# Patient Record
Sex: Male | Born: 1985 | Race: Black or African American | Hispanic: No | Marital: Single | Smoking: Current every day smoker
Health system: Southern US, Community
[De-identification: ages and names within clinical notes are randomized; demographics above are authoritative.]

---
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
## 2010-03-27 ENCOUNTER — Inpatient Hospital Stay (HOSPITAL_COMMUNITY)
Admission: EM | Admit: 2010-03-27 | Discharge: 2010-03-29 | Payer: Self-pay | Source: Home / Self Care | Attending: Internal Medicine | Admitting: Internal Medicine

## 2010-04-03 LAB — CULTURE, ROUTINE-ABSCESS

## 2010-04-03 LAB — GRAM STAIN

## 2010-04-03 LAB — URINALYSIS, ROUTINE W REFLEX MICROSCOPIC
Bilirubin Urine: NEGATIVE
Hgb urine dipstick: NEGATIVE
Ketones, ur: NEGATIVE mg/dL
Nitrite: NEGATIVE
Protein, ur: NEGATIVE mg/dL
Specific Gravity, Urine: 1.024 (ref 1.005–1.030)
Urine Glucose, Fasting: NEGATIVE mg/dL
Urobilinogen, UA: 1 mg/dL (ref 0.0–1.0)
pH: 6.5 (ref 5.0–8.0)

## 2010-04-03 LAB — POCT I-STAT, CHEM 8
BUN: 10 mg/dL (ref 6–23)
Calcium, Ion: 1.19 mmol/L (ref 1.12–1.32)
Chloride: 101 mEq/L (ref 96–112)
Creatinine, Ser: 1.1 mg/dL (ref 0.4–1.5)
Glucose, Bld: 83 mg/dL (ref 70–99)
HCT: 47 % (ref 39.0–52.0)
Hemoglobin: 16 g/dL (ref 13.0–17.0)
Potassium: 4.3 mEq/L (ref 3.5–5.1)
Sodium: 137 mEq/L (ref 135–145)
TCO2: 29 mmol/L (ref 0–100)

## 2010-04-03 LAB — RAPID URINE DRUG SCREEN, HOSP PERFORMED
Amphetamines: NOT DETECTED
Barbiturates: NOT DETECTED
Benzodiazepines: NOT DETECTED
Cocaine: NOT DETECTED
Opiates: POSITIVE — AB
Tetrahydrocannabinol: POSITIVE — AB

## 2010-04-03 LAB — DIFFERENTIAL
Basophils Absolute: 0 10*3/uL (ref 0.0–0.1)
Basophils Relative: 0 % (ref 0–1)
Eosinophils Absolute: 0.2 10*3/uL (ref 0.0–0.7)
Eosinophils Relative: 3 % (ref 0–5)
Lymphocytes Relative: 31 % (ref 12–46)
Lymphs Abs: 2.1 10*3/uL (ref 0.7–4.0)
Monocytes Absolute: 0.5 10*3/uL (ref 0.1–1.0)
Monocytes Relative: 8 % (ref 3–12)
Neutro Abs: 4 10*3/uL (ref 1.7–7.7)
Neutrophils Relative %: 58 % (ref 43–77)

## 2010-04-03 LAB — CBC
HCT: 42.9 % (ref 39.0–52.0)
Hemoglobin: 14.1 g/dL (ref 13.0–17.0)
MCH: 29.3 pg (ref 26.0–34.0)
MCHC: 32.9 g/dL (ref 30.0–36.0)
MCV: 89.2 fL (ref 78.0–100.0)
Platelets: 183 10*3/uL (ref 150–400)
RBC: 4.81 MIL/uL (ref 4.22–5.81)
RDW: 12.3 % (ref 11.5–15.5)
WBC: 6.8 10*3/uL (ref 4.0–10.5)

## 2010-04-03 LAB — HIV ANTIBODY (ROUTINE TESTING W REFLEX): HIV: NONREACTIVE

## 2010-08-18 ENCOUNTER — Emergency Department (HOSPITAL_COMMUNITY)
Admission: EM | Admit: 2010-08-18 | Discharge: 2010-08-18 | Disposition: A | Discharge status: Home / Self Care | Payer: Self-pay | Attending: Emergency Medicine | Admitting: Emergency Medicine

## 2010-08-18 DIAGNOSIS — R300 Dysuria: Secondary | ICD-10-CM | POA: Insufficient documentation

## 2010-08-18 DIAGNOSIS — N390 Urinary tract infection, site not specified: Secondary | ICD-10-CM | POA: Insufficient documentation

## 2010-08-18 LAB — URINE MICROSCOPIC-ADD ON

## 2010-08-18 LAB — URINALYSIS, ROUTINE W REFLEX MICROSCOPIC
Bilirubin Urine: NEGATIVE
Glucose, UA: NEGATIVE mg/dL
Ketones, ur: NEGATIVE mg/dL
Nitrite: NEGATIVE
Protein, ur: NEGATIVE mg/dL
Specific Gravity, Urine: 1.016 (ref 1.005–1.030)
Urobilinogen, UA: 0.2 mg/dL (ref 0.0–1.0)
pH: 6 (ref 5.0–8.0)

## 2010-08-21 LAB — GC/CHLAMYDIA PROBE AMP, GENITAL
Chlamydia, DNA Probe: NEGATIVE
GC Probe Amp, Genital: POSITIVE — AB

## 2011-01-04 ENCOUNTER — Inpatient Hospital Stay (INDEPENDENT_AMBULATORY_CARE_PROVIDER_SITE_OTHER)
Admission: RE | Admit: 2011-01-04 | Discharge: 2011-01-04 | Disposition: A | Discharge status: Home / Self Care | Payer: Self-pay | Source: Ambulatory Visit | Attending: Family Medicine | Admitting: Family Medicine

## 2011-01-04 DIAGNOSIS — T148 Other injury of unspecified body region: Secondary | ICD-10-CM

## 2013-10-09 ENCOUNTER — Emergency Department (HOSPITAL_COMMUNITY)
Admission: EM | Admit: 2013-10-09 | Discharge: 2013-10-09 | Disposition: A | Discharge status: Home / Self Care | Payer: No Typology Code available for payment source | Attending: Emergency Medicine | Admitting: Emergency Medicine

## 2013-10-09 ENCOUNTER — Emergency Department (HOSPITAL_COMMUNITY): Payer: No Typology Code available for payment source

## 2013-10-09 ENCOUNTER — Encounter (HOSPITAL_COMMUNITY): Payer: Self-pay | Admitting: Emergency Medicine

## 2013-10-09 DIAGNOSIS — S8990XA Unspecified injury of unspecified lower leg, initial encounter: Secondary | ICD-10-CM | POA: Insufficient documentation

## 2013-10-09 DIAGNOSIS — Y9389 Activity, other specified: Secondary | ICD-10-CM | POA: Insufficient documentation

## 2013-10-09 DIAGNOSIS — IMO0002 Reserved for concepts with insufficient information to code with codable children: Secondary | ICD-10-CM | POA: Insufficient documentation

## 2013-10-09 DIAGNOSIS — Y92410 Unspecified street and highway as the place of occurrence of the external cause: Secondary | ICD-10-CM | POA: Insufficient documentation

## 2013-10-09 DIAGNOSIS — S4991XA Unspecified injury of right shoulder and upper arm, initial encounter: Secondary | ICD-10-CM

## 2013-10-09 DIAGNOSIS — S99929A Unspecified injury of unspecified foot, initial encounter: Secondary | ICD-10-CM

## 2013-10-09 DIAGNOSIS — S46909A Unspecified injury of unspecified muscle, fascia and tendon at shoulder and upper arm level, unspecified arm, initial encounter: Secondary | ICD-10-CM | POA: Insufficient documentation

## 2013-10-09 DIAGNOSIS — F17200 Nicotine dependence, unspecified, uncomplicated: Secondary | ICD-10-CM | POA: Insufficient documentation

## 2013-10-09 DIAGNOSIS — S4980XA Other specified injuries of shoulder and upper arm, unspecified arm, initial encounter: Secondary | ICD-10-CM | POA: Insufficient documentation

## 2013-10-09 DIAGNOSIS — S99919A Unspecified injury of unspecified ankle, initial encounter: Secondary | ICD-10-CM

## 2013-10-09 MED ORDER — NAPROXEN 500 MG PO TABS: 500.0000 mg | ORAL_TABLET | Freq: Two times a day (BID) | ORAL | Status: DC

## 2013-10-09 NOTE — ED Notes (Signed)
Bed: [id number] Expected date:  Expected time:  Means of arrival:  Comments: ems
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
## 2013-10-09 NOTE — Discharge Instructions (Signed)
Take Naprosyn as needed for pain. Refer to attached documents for more information.  °

## 2013-10-16 NOTE — ED Provider Notes (Signed)
Medical screening examination/treatment/procedure(s) were performed by non-physician practitioner and as supervising physician I was immediately available for consultation/collaboration.   EKG Interpretation None        Khani Paino, MD 10/16/13 0021 

## 2013-12-11 ENCOUNTER — Emergency Department (HOSPITAL_COMMUNITY)
Admission: EM | Admit: 2013-12-11 | Discharge: 2013-12-11 | Disposition: A | Discharge status: Home / Self Care | Payer: No Typology Code available for payment source | Attending: Emergency Medicine | Admitting: Emergency Medicine

## 2013-12-11 ENCOUNTER — Encounter (HOSPITAL_COMMUNITY): Payer: Self-pay | Admitting: Emergency Medicine

## 2013-12-11 DIAGNOSIS — F17200 Nicotine dependence, unspecified, uncomplicated: Secondary | ICD-10-CM | POA: Insufficient documentation

## 2013-12-11 DIAGNOSIS — Z202 Contact with and (suspected) exposure to infections with a predominantly sexual mode of transmission: Secondary | ICD-10-CM

## 2013-12-11 DIAGNOSIS — Z79899 Other long term (current) drug therapy: Secondary | ICD-10-CM | POA: Insufficient documentation

## 2013-12-11 MED ORDER — METRONIDAZOLE 500 MG PO TABS
2000.0000 mg | ORAL_TABLET | Freq: Once | ORAL | Status: AC
  Administered 2013-12-11: 2000 mg via ORAL
  Filled 2013-12-11: qty 4

## 2013-12-11 NOTE — ED Provider Notes (Signed)
CSN: [id number]     Arrival date & time 12/11/13  1358 History      Chief Complaint  Patient presents with  . Exposure to STD    The history is provided by the patient. No language interpreter was used.   HPI Comments: Frank Jordan is a 28 y.o. male who presents to the Emergency Department complaining of STD exposure.  States his girlfriend found out yesterday she has trichomonas.  Pt denies any symptoms.  Denies fevers, chills, myalgias, N/V, abdominal pain, dysuria, urinary frequency/urgency, penile discharge, testicular pain or swelling.       History reviewed. No pertinent past medical history. History reviewed. No pertinent past surgical history. History reviewed. No pertinent family history. History  Substance Use Topics  . Smoking status: Current Every Day Smoker -- 0.50 packs/day for 5 years    Types: Cigarettes  . Smokeless tobacco: Not on file  . Alcohol Use: No    Review of Systems  All other systems reviewed and are negative.     Allergies  Review of patient's allergies indicates no known allergies.  Home Medications   Prior to Admission medications   Medication Sig Start Date End Date Taking? Authorizing Provider  naproxen (NAPROSYN) 500 MG tablet Take 1 tablet (500 mg total) by mouth 2 (two) times daily with a meal. 10/09/13   Kaitlyn Szekalski, PA-C   BP 153/85  Pulse 109  Temp(Src) 97.6 F (36.4 C) (Oral)  Resp 18  SpO2 100% Physical Exam  Nursing note and vitals reviewed. Constitutional: He appears well-developed and well-nourished. No distress.  HENT:  Head: Normocephalic and atraumatic.  Neck: Neck supple.  Pulmonary/Chest: Effort normal.  Abdominal: Soft. He exhibits no distension. There is no tenderness. There is no rebound and no guarding.  Genitourinary: Penis normal. Right testis shows no mass, no swelling and no tenderness. Right testis is descended. Left testis shows no mass, no swelling and no tenderness. Left testis is descended.  Circumcised. No penile erythema or penile tenderness. No discharge found.  Lymphadenopathy:       Right: No inguinal adenopathy present.       Left: No inguinal adenopathy present.  Neurological: He is alert.  Skin: He is not diaphoretic.  Psychiatric: He has a normal mood and affect. His behavior is normal.    ED Course  Procedures (including critical care time) DIAGNOSTIC STUDIES: Oxygen Saturation is 100% on RA,normal by my interpretation.    COORDINATION OF CARE: 3:03 PM Discussed treatment plan with pt at bedside and pt agreed to plan.    Labs Review Labs Reviewed - No data to display  Imaging Review No results found.   EKG Interpretation None      MDM   Final diagnoses:  STD exposure    Afebrile nontoxic patient with reported STD exposure to trichomonas. Patient is asymptomatic. STD panel ordered. Patient treated empirically for for trichomonas with Flagyl. Discharged home with Dr. Chaney Born health department followup.  Pt aware all tests are pending. Discussedfindings, treatment, and follow up  with patient.  Pt given return precautions.  Pt verbalizes understanding and agrees with plan.         [place], PA-C 12/11/13 1519

## 2013-12-11 NOTE — ED Provider Notes (Signed)
Medical screening examination/treatment/procedure(s) were performed by non-physician practitioner and as supervising physician I was immediately available for consultation/collaboration.    Vida Roller, MD 12/11/13 [id number]

## 2013-12-11 NOTE — Discharge Instructions (Signed)
Read the information below.  You may return to the Emergency Department at any time for worsening condition or any new symptoms that concern you. °

## 2013-12-11 NOTE — ED Notes (Signed)
Pt wanting to be checked for std due to ex\posure, no complaints.

## 2013-12-12 LAB — GC/CHLAMYDIA PROBE AMP
CT Probe RNA: NEGATIVE
GC Probe RNA: NEGATIVE

## 2013-12-12 LAB — RPR

## 2013-12-12 LAB — HIV ANTIBODY (ROUTINE TESTING W REFLEX): HIV 1&2 Ab, 4th Generation: NONREACTIVE

## 2014-02-01 ENCOUNTER — Emergency Department (HOSPITAL_COMMUNITY)
Admission: EM | Admit: 2014-02-01 | Discharge: 2014-02-01 | Disposition: A | Discharge status: Home / Self Care | Payer: No Typology Code available for payment source | Attending: Emergency Medicine | Admitting: Emergency Medicine

## 2014-02-01 ENCOUNTER — Encounter (HOSPITAL_COMMUNITY): Payer: Self-pay | Admitting: *Deleted

## 2014-02-01 DIAGNOSIS — R369 Urethral discharge, unspecified: Secondary | ICD-10-CM | POA: Insufficient documentation

## 2014-02-01 DIAGNOSIS — Z113 Encounter for screening for infections with a predominantly sexual mode of transmission: Secondary | ICD-10-CM | POA: Insufficient documentation

## 2014-02-01 DIAGNOSIS — Z202 Contact with and (suspected) exposure to infections with a predominantly sexual mode of transmission: Secondary | ICD-10-CM

## 2014-02-01 DIAGNOSIS — Z711 Person with feared health complaint in whom no diagnosis is made: Secondary | ICD-10-CM

## 2014-02-01 DIAGNOSIS — Z720 Tobacco use: Secondary | ICD-10-CM | POA: Insufficient documentation

## 2014-02-01 LAB — URINALYSIS, ROUTINE W REFLEX MICROSCOPIC
Bilirubin Urine: NEGATIVE
Glucose, UA: NEGATIVE mg/dL
Hgb urine dipstick: NEGATIVE
Ketones, ur: 15 mg/dL — AB
Leukocytes, UA: NEGATIVE
Nitrite: NEGATIVE
Protein, ur: NEGATIVE mg/dL
Specific Gravity, Urine: 1.027 (ref 1.005–1.030)
Urobilinogen, UA: 1 mg/dL (ref 0.0–1.0)
pH: 6.5 (ref 5.0–8.0)

## 2014-02-01 LAB — RPR

## 2014-02-01 MED ORDER — CEFTRIAXONE SODIUM 250 MG IJ SOLR
250.0000 mg | Freq: Once | INTRAMUSCULAR | Status: AC
  Administered 2014-02-01: 250 mg via INTRAMUSCULAR
  Filled 2014-02-01: qty 250

## 2014-02-01 MED ORDER — LIDOCAINE HCL (PF) 1 % IJ SOLN
5.0000 mL | Freq: Once | INTRAMUSCULAR | Status: AC
  Administered 2014-02-01: 5 mL

## 2014-02-01 MED ORDER — METRONIDAZOLE 500 MG PO TABS
2000.0000 mg | ORAL_TABLET | Freq: Once | ORAL | Status: AC
  Administered 2014-02-01: 2000 mg via ORAL
  Filled 2014-02-01: qty 4

## 2014-02-01 MED ORDER — LIDOCAINE HCL (PF) 1 % IJ SOLN
INTRAMUSCULAR | Status: AC
  Administered 2014-02-01: 5 mL
  Filled 2014-02-01: qty 5

## 2014-02-01 MED ORDER — AZITHROMYCIN 250 MG PO TABS
1000.0000 mg | ORAL_TABLET | Freq: Once | ORAL | Status: AC
  Administered 2014-02-01: 1000 mg via ORAL
  Filled 2014-02-01: qty 4

## 2014-02-01 NOTE — Discharge Instructions (Signed)
1. Medications: usual home medications 2. Treatment: rest, drink plenty of fluids,  3. Follow Up: Please followup with your primary doctor in 1 week for discussion of your diagnoses and further evaluation after today's visit; if you do not have a primary care doctor use the resource guide provided to find one; Please return to the ER for  abd pain, N/V/D, fevers or other concerning symptoms.   Sexually Transmitted Disease A sexually transmitted disease (STD) is a disease or infection that may be passed (transmitted) from person to person, usually during sexual activity. This may happen by way of saliva, semen, blood, vaginal mucus, or urine. Common STDs include:   Gonorrhea.   Chlamydia.   Syphilis.   HIV and AIDS.   Genital herpes.   Hepatitis B and C.   Trichomonas.   Human papillomavirus (HPV).   Pubic lice.   Scabies.  Mites.  Bacterial vaginosis. WHAT ARE CAUSES OF STDs? An STD may be caused by bacteria, a virus, or parasites. STDs are often transmitted during sexual activity if one person is infected. However, they may also be transmitted through nonsexual means. STDs may be transmitted after:   Sexual intercourse with an infected person.   Sharing sex toys with an infected person.   Sharing needles with an infected person or using unclean piercing or tattoo needles.  Having intimate contact with the genitals, mouth, or rectal areas of an infected person.   Exposure to infected fluids during birth. WHAT ARE THE SIGNS AND SYMPTOMS OF STDs? Different STDs have different symptoms. Some people may not have any symptoms. If symptoms are present, they may include:   Painful or bloody urination.   Pain in the pelvis, abdomen, vagina, anus, throat, or eyes.   A skin rash, itching, or irritation.  Growths, ulcerations, blisters, or sores in the genital and anal areas.  Abnormal vaginal discharge with or without bad odor.   Penile discharge in men.    Fever.   Pain or bleeding during sexual intercourse.   Swollen glands in the groin area.   Yellow skin and eyes (jaundice). This is seen with hepatitis.   Swollen testicles.  Infertility.  Sores and blisters in the mouth. HOW ARE STDs DIAGNOSED? To make a diagnosis, your health care provider may:   Take a medical history.   Perform a physical exam.   Take a sample of any discharge to examine.  Swab the throat, cervix, opening to the penis, rectum, or vagina for testing.  Test a sample of your first morning urine.   Perform blood tests.   Perform a Pap test, if this applies.   Perform a colposcopy.   Perform a laparoscopy.  HOW ARE STDs TREATED? Treatment depends on the STD. Some STDs may be treated but not cured.   Chlamydia, gonorrhea, trichomonas, and syphilis can be cured with antibiotic medicine.   Genital herpes, hepatitis, and HIV can be treated, but not cured, with prescribed medicines. The medicines lessen symptoms.   Genital warts from HPV can be treated with medicine or by freezing, burning (electrocautery), or surgery. Warts may come back.   HPV cannot be cured with medicine or surgery. However, abnormal areas may be removed from the cervix, vagina, or vulva.   If your diagnosis is confirmed, your recent sexual partners need treatment. This is true even if they are symptom-free or have a negative culture or evaluation. They should not have sex until their health care providers say it is okay. HOW CAN I  REDUCE MY RISK OF GETTING AN STD? Take these steps to reduce your risk of getting an STD:  Use latex condoms, dental dams, and water-soluble lubricants during sexual activity. Do not use petroleum jelly or oils.  Avoid having multiple sex partners.  Do not have sex with someone who has other sex partners.  Do not have sex with anyone you do not know or who is at high risk for an STD.  Avoid risky sex practices that can break your  skin.  Do not have sex if you have open sores on your mouth or skin.  Avoid drinking too much alcohol or taking illegal drugs. Alcohol and drugs can affect your judgment and put you in a vulnerable position.  Avoid engaging in oral and anal sex acts.  Get vaccinated for HPV and hepatitis. If you have not received these vaccines in the past, talk to your health care provider about whether one or both might be right for you.   If you are at risk of being infected with HIV, it is recommended that you take a prescription medicine daily to prevent HIV infection. This is called pre-exposure prophylaxis (PrEP). You are considered at risk if:  You are a man who has sex with other men (MSM).  You are a heterosexual man or woman and are sexually active with more than one partner.  You take drugs by injection.  You are sexually active with a partner who has HIV.  Talk with your health care provider about whether you are at high risk of being infected with HIV. If you choose to begin PrEP, you should first be tested for HIV. You should then be tested every 3 months for as long as you are taking PrEP.  WHAT SHOULD I DO IF I THINK I HAVE AN STD?  See your health care provider.   Tell your sexual partner(s). They should be tested and treated for any STDs.  Do not have sex until your health care provider says it is okay. WHEN SHOULD I GET IMMEDIATE MEDICAL CARE? Contact your health care provider right away if:   You have severe abdominal pain.  You are a man and notice swelling or pain in your testicles.  You are a woman and notice swelling or pain in your vagina. Document Released: 05/26/2002 Document Revised: 03/10/2013 Document Reviewed: 09/23/2012 [facility] Patient Information 2015 [place], [place]. This information is not intended to replace advice given to you by your health care provider. Make sure you discuss any questions you have with your health care provider.   Emergency  Department Resource Guide 1) Find a Doctor and Pay Out of Pocket Although you won't have to find out who is covered by your insurance plan, it is a good idea to ask around and get recommendations. You will then need to call the office and see if the doctor you have chosen will accept you as a new patient and what types of options they offer for patients who are self-pay. Some doctors offer discounts or will set up payment plans for their patients who do not have insurance, but you will need to ask so you aren't surprised when you get to your appointment.  2) Contact Your Local Health Department Not all health departments have doctors that can see patients for sick visits, but many do, so it is worth a call to see if yours does. If you don't know where your local health department is, you can check in your phone book. The  CDC also has a tool to help you locate your state's health department, and many state websites also have listings of all of their local health departments.  3) Find a Walk-in Clinic If your illness is not likely to be very severe or complicated, you may want to try a walk in clinic. These are popping up all over the country in pharmacies, drugstores, and shopping centers. They're usually staffed by nurse practitioners or physician assistants that have been trained to treat common illnesses and complaints. They're usually fairly quick and inexpensive. However, if you have serious medical issues or chronic medical problems, these are probably not your best option.  No Primary Care Doctor: - Call Health Connect at  [phone] - they can help you locate a primary care doctor that  accepts your insurance, provides certain services, etc. - Physician Referral Service- [phone]  Chronic Pain Problems: Organization         Address  Phone   Notes  [facility]  [phone] Patients need to be referred by their primary care doctor.   Medication  Assistance: Organization         Address  Phone   Notes  [facility]ication [facility] [address] [place], [place] [address] [phone] --Must be a resident of [facility] -- Must have NO insurance coverage whatsoever (no Medicaid/ Medicare, etc.) -- The pt. MUST have a primary care doctor that directs their care regularly and follows them in the community   Med[facility]ist  [phone]   [employer]  [phone]    Agencies that provide inexpensive medical care: Organization         Address  Phone   Notes  [facility]  [phone]   [facility]    [phone]   [facility] [address] [place], [place] [address] [phone]) [phone]   [facility] [address], [place] [phone]   Planned Parenthood    [phone]   [facility]    [phone]   [facility]  [address], [place] Phone:  [phone]) [phone], Fax:  [phone] Hours of Operation:  9 am - 6 pm, M-F.  Also accepts Medicaid/Medicare and self-pay.  [facility]  [address], [place] Phone: [phone], Fax: [phone]. Hours of Operation:  8:30 am - 5:30 pm, M-F.  Also accepts Medicaid and self-pay.  [facility] [place] [address], [place] Phone: [phone]   [facility] [address], [place], [place] [phone], Ext. [phone] Mondays & Thursdays: 7-9 AM.  First 15 patients are seen on a first come, first serve basis.    Medicaid-accepting [facility] Providers:  Organization         Address  Phone   Notes  [facility] [address],  [phone] Also accepts self-pay patients.  [facility] [address], [place]  [phone]   [facility] [address], [place]  [phone]   [facility] [address] [place] [address], [place] [phone]) [phone]   Renaye RakersVeita Bland [address], [place]   [phone]) [phone] Only accepts [place] Access [place] patients after they have their name applied to their card.   Self-Pay (no  insurance) in [place]:  Organization         Address  Phone   Notes  Sickle Cell Patients, [facility] [address] [place], [place] [phone]   [facility] [address] [place], [place] [phone]   [facility] [place]  [address], [place] [phone]   [facility]/Dr. Osei-Bonsu  [address] [place] or [address], [place] [phone] Phone number for both [place] and [place] locations is the same.  [facility] [address], [place] [phone]   [facility] [address], [place] or [address] [phone]   [facility] [address] [place] [phone], phone; [phone], fax Sees patients 1st and 3rd Saturday of every month.  Must not qualify for public or private insurance (i.e. Medicaid, Medicare, [place] Health Choice, Veterans' Benefits)  Household income should be no more than 200% of the poverty level The clinic cannot treat you if you are pregnant or think you are pregnant  Sexually transmitted diseases are not treated at the clinic.    Dental Care: Organization         Address  Phone  Notes  [facility] of [facility] [address] [place], [place] [phone] Accepts children up to age 3 who are enrolled in [place] or [place] Health Choice; pregnant women with a Medicaid card; and children who have applied for Medicaid or [place] Health Choice, but were declined, whose parents can pay a reduced fee at time of service.  [facility]  Department [facility]  [address], [place] [phone] Accepts children up to age 54 who are enrolled in [place] or [place] Health Choice; pregnant women with a Medicaid card; and children who have applied for Medicaid or [place] Health Choice, but were declined, whose parents can pay a reduced fee at time of service.  [facility]  [address] [place], [place] [phone] Patients are seen by appointment only. Walk-ins are not accepted. [facility] will see patients 91 years of age and older. Monday - Tuesday (8am-5pm) Most Wednesdays (8:30-5pm) $30 per visit, cash only  [facility]  [address], [facility] [phone] Patients are seen by appointment only. Walk-ins are not accepted. [facility] will see patients 17 years of age and older. One Wednesday Evening (Monthly: Volunteer Based).  $30 per visit, cash only  [employer]  [phone] for adults; Children under age 30, call Graduate Pediatric Dentistry at [phone]. Children aged 37-14, please call [phone] to request a pediatric application.  Dental services are provided in all areas of dental care including fillings, crowns and bridges, complete and partial dentures, implants, gum treatment, root canals, and extractions. Preventive care is also provided. Treatment is provided to both adults and children. Patients are selected via a lottery and there is often a waiting list.   [facility] [address] [place]  [phone] [email or web address]   [facility] [address] [place], [place] [phone], Ext. [phone] Second and Fourth Thursday of each month, opens at 6:30 AM; Clinic ends at 9 AM.  Patients are seen on a first-come first-served basis, and a limited number are seen during  each clinic.   [facility]  [address] Ether [address]ins [place], [place] [phone]    Eligibility Requirements You must have lived in [place], [place], or [place] counties for at least the last three months.   You cannot be eligible for state or federal sponsored [place], including [employer], [place], or [employer].   You generally cannot be eligible for healthcare insurance through your employer.    How to apply: Eligibility screenings are held every Tuesday and Wednesday afternoon from 1:00 pm until 4:00 pm. You do not need an appointment for the interview!  [facility] [address], [place], [place] [phone]   [facility] Health Department  [phone]   [facility] Health Department  [phone]   [facility] Health Department  [phone]    Behavioral Health Resources in the Community: Intensive Outpatient Programs Organization         Address  Phone  Notes  [facility] [address] [place], [place] [phone]   [facility]patient [address], [place], [place] [phone]   [facility] [address] [place], [place]  [phone]   [facility] [address],  [place], [place] [phone] or [phone]   Substance Abuse Resources Organization         Address  Phone  Notes  [facility] Drug Services  [phone]   [facility]  [phone]   [facility] [place]  [phone]   Floydene Flock  [phone]   Residential & Outpatient Substance Abuse Program  [phone]   Psychological Services Organization         Address  Phone  Notes  [facility]  [phone]   [facility] Services  [phone]   [facility] [address], [place] [phone] or [phone]    Mobile Crisis Teams Organization         Address  Phone  Notes  [facility], Mobile Crisis Care Unit  [phone]   [facility]  [address]  [place], [place] [phone]   Doristine Locks [address] [place] [phone]    Self-Help/Support Groups Organization         Address  Phone             Notes  [facility] [place] - variety of support groups  336- I7437963 Call for more information  Narcotics Anonymous (NA), [facility] [address], [employer] [place]  2 meetings at this location   Statistician         Address  Phone  Notes  [facility] [address],    [place]  [phone]   [employer]  [address] [place] [address], [place], [place] [phone]   [facility] [address] [place], [place] [phone] Admissions: 8am-3pm M-F  [facility] [address],    [place], [place] [phone]   [facility] [address] [place], [place] [phone]   [facility] [address],  [place], [place] [phone]   [facility] - Intensive Outpatient [address], [place], [place] [phone]   [facility] (Addiction Recovery [facility] [address] [place]  [place], [place] [phone] or [phone]   [facility] ([facility]) [address], [place], [place] [phone] Accepts Medicaid  [facility] [address]  [place]  [place] [phone] Substance Abuse/Addiction Treatment   [facility] Organization         Address  Phone  Notes  [facility]  [phone]   Angie Fava, PhD [address] Ervin Knack [place], [place]   [phone] or [phone]   [facility]   [address] [place], [place] [phone]   [facility] [address] [place], [place] [phone] Insurance/Medicaid/sponsorship through [facility] Families [address]                                    [place], [place] [phone] Therapy/tele-psych/case    [facility] [address], [place] [phone]    Dr. Lolly Mustache  [phone]   [facility]  [employer] [facility] Dept. 1) [address], [place] 2) [address] [place] 3)  [address], [place] [phone]  [phone]   [facility] Child Abuse Hotline [phone] or [phone] (After Hours)

## 2014-02-01 NOTE — ED Notes (Signed)
Refused wheelchair 

## 2014-02-01 NOTE — ED Provider Notes (Signed)
CSN: [id number]     Arrival date & time 02/01/14  1245 History  This chart was scribed for non-physician practitioner Dierdre ForthHannah Mahealani Sulak, PA-C, working with Linwood DibblesJon Knapp, MD by Littie Deedsichard Sun, ED Scribe. This patient was seen in room TR10C/TR10C and the patient's care was started at 2:55 PM.     Chief Complaint  Patient presents with  . Exposure to STD    The history is provided by the patient. No language interpreter was used.   HPI Comments: Frank Jordan is a 28 y.o. male who presents to the Emergency Department complaining of exposure to STD. Patient states his girlfriend of 6 months is pregnant; she went in to see a doctor for first visit and she was told she has an STD (trichomonas). He does not always use a condom when having sexual intercourse. Patient would like to be checked for STDs. He has hx of a prior STD, but he does not remember what STD he had. He denies testicular pain, penile pain, penile discharge, dysuria, and abdominal pain.  History reviewed. No pertinent past medical history. History reviewed. No pertinent past surgical history. History reviewed. No pertinent family history. History  Substance Use Topics  . Smoking status: Current Every Day Smoker -- 0.50 packs/day for 5 years    Types: Cigarettes  . Smokeless tobacco: Not on file  . Alcohol Use: No    Review of Systems  Constitutional: Negative for fever, diaphoresis, appetite change, fatigue and unexpected weight change.  HENT: Negative for mouth sores.   Eyes: Negative for visual disturbance.  Respiratory: Negative for cough, chest tightness, shortness of breath and wheezing.   Cardiovascular: Negative for chest pain.  Gastrointestinal: Negative for nausea, vomiting, abdominal pain, diarrhea and constipation.  Endocrine: Negative for polydipsia, polyphagia and polyuria.  Genitourinary: Negative for dysuria, urgency, frequency, hematuria, discharge, penile pain and testicular pain.  Musculoskeletal: Negative for  back pain and neck stiffness.  Skin: Negative for rash.  Allergic/Immunologic: Negative for immunocompromised state.  Neurological: Negative for syncope, light-headedness and headaches.  Hematological: Does not bruise/bleed easily.  Psychiatric/Behavioral: Negative for sleep disturbance. The patient is not nervous/anxious.       Allergies  Review of patient's allergies indicates no known allergies.  Home Medications   Prior to Admission medications   Not on File   BP 147/90 mmHg  Pulse 105  Temp(Src) 98.4 F (36.9 C) (Oral)  Resp 18  SpO2 97% Physical Exam  Constitutional: He appears well-developed and well-nourished. No distress.  Awake, alert, nontoxic appearance  HENT:  Head: Normocephalic and atraumatic.  Mouth/Throat: Oropharynx is clear and moist. No oropharyngeal exudate.  Eyes: Conjunctivae are normal. No scleral icterus.  Neck: Normal range of motion. Neck supple.  Cardiovascular: Normal rate, regular rhythm, normal heart sounds and intact distal pulses.   Pulmonary/Chest: Effort normal and breath sounds normal. No respiratory distress. He has no wheezes.  Equal chest expansion  Abdominal: Soft. Bowel sounds are normal. He exhibits no mass. There is no tenderness. There is no rebound and no guarding. Hernia confirmed negative in the right inguinal area and confirmed negative in the left inguinal area.  Genitourinary: Testes normal. Right testis shows no mass, no swelling and no tenderness. Right testis is descended. Cremasteric reflex is not absent on the right side. Left testis shows no mass, no swelling and no tenderness. Left testis is descended. Cremasteric reflex is not absent on the left side. No phimosis, paraphimosis, hypospadias, penile erythema or penile tenderness. Discharge (clear) found.  Clear discharge from the urethral meatus.   Musculoskeletal: Normal range of motion. He exhibits no edema.  Lymphadenopathy:       Right: No inguinal adenopathy present.        Left: No inguinal adenopathy present.  Neurological: He is alert.  Speech is clear and goal oriented Moves extremities without ataxia  Skin: Skin is warm and dry. He is not diaphoretic.  Psychiatric: He has a normal mood and affect.  Nursing note and vitals reviewed.   ED Course  Procedures  DIAGNOSTIC STUDIES: Oxygen Saturation is 97% on room air, normal by my interpretation.    COORDINATION OF CARE: 2:58 PM-Discussed treatment plan which includes STD treatment with pt at bedside and pt agreed to plan.    Labs Review Labs Reviewed  URINALYSIS, ROUTINE W REFLEX MICROSCOPIC - Abnormal; Notable for the following:    Ketones, ur 15 (*)    All other components within normal limits  GC/CHLAMYDIA PROBE AMP  RPR  HIV ANTIBODY (ROUTINE TESTING)    Imaging Review No results found.   EKG Interpretation None      MDM   Final diagnoses:  Concern about STD in male without diagnosis  STD exposure   Frank Jordan Presents with concerns for STD and exposure to trichomonas.  STD cultures obtained including HIV, syphilis, gonorrhea, Chlamydia and urinalysis sent for evaluation for trichomonas.  A shin treated with azithromycin, Rocephin and metronidazole to cover for gonorrhea, chlamydia and trichomonas. Patient to be discharged with instructions to follow up with PCP. Discussed importance of using protection when sexually active. Pt understands that they have GC/Chlamydia cultures pending and that they will need to inform all sexual partners if results return positive. I have also discussed reasons to return immediately to the ER. Patient expresses understanding and agrees with plan.  I have personally reviewed patient's vitals, nursing note and any pertinent labs or imaging.  I performed an focused physical exam; undressed when appropriate .    It has been determined that no acute conditions requiring further emergency intervention are present at this time. The patient/guardian  have been advised of the diagnosis and plan. I reviewed any labs and imaging including any potential incidental findings. We have discussed signs and symptoms that warrant return to the ED and they are listed in the discharge instructions.    Vital signs are stable at discharge.   BP 147/90 mmHg  Pulse 105  Temp(Src) 98.4 F (36.9 C) (Oral)  Resp 18  SpO2 97%  I personally performed the services described in this documentation, which was scribed in my presence. The recorded information has been reviewed and is accurate.    Dahlia ClientHannah Tahjanae Blankenburg, PA-C 02/01/14 1541  Linwood DibblesJon Knapp, MD 02/02/14 [phone]

## 2014-02-01 NOTE — ED Notes (Signed)
Pt states his girlfriend is preg and she went to dr for first visit and she was told she has STD now pt is here to be checked for same

## 2014-02-01 NOTE — ED Notes (Signed)
Pt reports possible exposure to std and wants to be checked, denies all symptoms.

## 2014-02-02 LAB — HIV ANTIBODY (ROUTINE TESTING W REFLEX): HIV 1&2 Ab, 4th Generation: NONREACTIVE

## 2014-02-02 LAB — GC/CHLAMYDIA PROBE AMP
CT Probe RNA: NEGATIVE
GC Probe RNA: NEGATIVE

## 2015-10-18 ENCOUNTER — Emergency Department (HOSPITAL_COMMUNITY)
Admission: EM | Admit: 2015-10-18 | Discharge: 2015-10-18 | Disposition: A | Discharge status: Home / Self Care | Payer: Self-pay | Attending: Physician Assistant | Admitting: Physician Assistant

## 2015-10-18 ENCOUNTER — Encounter (HOSPITAL_COMMUNITY): Payer: Self-pay

## 2015-10-18 DIAGNOSIS — F17210 Nicotine dependence, cigarettes, uncomplicated: Secondary | ICD-10-CM | POA: Insufficient documentation

## 2015-10-18 DIAGNOSIS — M25512 Pain in left shoulder: Secondary | ICD-10-CM

## 2015-10-18 DIAGNOSIS — M545 Low back pain: Secondary | ICD-10-CM

## 2015-10-18 MED ORDER — NAPROXEN 500 MG PO TABS: 500.0000 mg | ORAL_TABLET | Freq: Two times a day (BID) | ORAL | 0 refills | Status: DC

## 2015-10-18 MED ORDER — METHOCARBAMOL 500 MG PO TABS: 500.0000 mg | ORAL_TABLET | Freq: Two times a day (BID) | ORAL | 0 refills | Status: DC

## 2015-10-18 NOTE — Discharge Instructions (Signed)
Take the prescribed medication as directed. Follow-up with a primary care doctor-- your insurance company can help you find one. If there are any issues with your insurance, may follow-up at the wellness. Return to the ED for new or worsening symptoms.

## 2015-10-18 NOTE — ED Notes (Signed)
Pt c/o LBP and left shoulder pain "for months". States he works at [employer].

## 2015-10-18 NOTE — ED Triage Notes (Signed)
Per Pt, Pt is coming from home with a complaint of left shoulder pain for one month and lower back pain intermittent for one year. Pt reports being employed at [employer] and being involved in moving boxes. Pt reports when lying abnormally and working, pain is worsened. Better with stretching.

## 2015-10-18 NOTE — ED Provider Notes (Signed)
[facility]-EMERGENCY DEPT Provider Note  First Provider Contact:  None    By signing my name below, I, Frank Jordan, attest that this documentation has been prepared under the direction and in the presence of Frank Sites, PA-C. Electronically Signed: Earmon Jordan, ED Scribe. 10/18/15. 1:10 PM.  History   Chief Complaint Chief Complaint  Patient presents with  . Back Pain  . Shoulder Pain    The history is provided by the patient and medical records. No language interpreter was used.    HPI Comments:  Frank Jordan is a 30 y.o. male who presents to the Emergency Department complaining of lower back pain and left shoulder pain that began several months ago. He states he works for [employer] and lifts heavy objects often and repetitively. He has not taken anything for pain today but has taken Ibuprofen 800 mg in the past with minimal relief. He denies modifying factors. He denies numbness, tingling or weakness of the upper extremities, bruising, wounds, trauma, injury or fall. He is right-hand dominant. He denies any known allergies to any medications. He does not have a PCP.  History reviewed. No pertinent past medical history.  There are no active problems to display for this patient.   History reviewed. No pertinent surgical history.     Home Medications    Prior to Admission medications   Not on File    Family History No family history on file.  Social History Social History  Substance Use Topics  . Smoking status: Current Every Day Smoker    Packs/day: 0.50    Years: 5.00    Types: Cigarettes  . Smokeless tobacco: Never Used  . Alcohol use Yes     Allergies   Review of patient's allergies indicates no known allergies.   Review of Systems Review of Systems  Musculoskeletal: Positive for back pain and myalgias.  Skin: Negative for color change and wound.  Neurological: Negative for weakness and numbness.  All other systems reviewed and are  negative.    Physical Exam Updated Vital Signs BP 120/85 (BP Location: Right Arm)   Pulse 108   Temp 98.4 F (36.9 C) (Oral)   Resp 20   SpO2 98%   Physical Exam  Constitutional: He is oriented to person, place, and time. He appears well-developed and well-nourished.  HENT:  Head: Normocephalic and atraumatic.  Mouth/Throat: Oropharynx is clear and moist.  Eyes: Conjunctivae and EOM are normal. Pupils are equal, round, and reactive to light.  Neck: Normal range of motion.  Cardiovascular: Normal rate, regular rhythm and normal heart sounds.   Pulmonary/Chest: Effort normal and breath sounds normal.  Abdominal: Soft. Bowel sounds are normal.  Musculoskeletal: Normal range of motion. He exhibits no edema or deformity.  Left shoulder normal in appearance, no swelling or bony deformities, no focal tenderness; full ROM maintained in all directions without any apparent pain, normal grip strength C/T spine non-tender L spine without midline deformities or step-off; muscular tenderness noted; normal strength/sensation of both legs, normal gait unassisted  Neurological: He is alert and oriented to person, place, and time.  Skin: Skin is warm and dry.  Psychiatric: He has a normal mood and affect.  Nursing note and vitals reviewed.    ED Treatments / Results  Labs (all labs ordered are listed, but only abnormal results are displayed) Labs Reviewed - No data to display  EKG  EKG Interpretation None       Radiology No results found.  Procedures Procedures (including critical  care time)  Medications Ordered in ED Medications - No data to display   Initial Impression / Assessment and Plan / ED Course  I have reviewed the triage vital signs and the nursing notes.  Pertinent labs & imaging results that were available during my care of the patient were reviewed by me and considered in my medical decision making (see chart for details).  Clinical Course  DIAGNOSTIC  STUDIES: Oxygen Saturation is 98% on RA, normal by my interpretation.   COORDINATION OF CARE: 1:07 PM- Will prescribe Robaxin and Naproxen and give referral to [facility] for follow up. Pt verbalizes understanding and agrees to plan.  Medications - No data to display  30 y.o. M here with Left shoulder and low back pain for past several months. He works for [employer]. He has no deformities noted on exam. His full range of motion of his left shoulder and lumbar spine. There is some muscular tenderness noted. No deficits to suggest cauda equina. No red flag symptoms. Symptoms likely muscular in nature related to his repetitive lifting throughout the day. Will treat symptomatically.  Does not have a PCP, encouraged to establish care with local office in the area. Advised that his insurance company can help with this.  Discussed plan with patient, he acknowledged understanding and agreed with plan of care.  Return precautions given for new or worsening symptoms.  I personally performed the services described in this documentation, which was scribed in my presence. The recorded information has been reviewed and is accurate.   Final Clinical Impressions(s) / ED Diagnoses   Final diagnoses:  Low back pain without sciatica, unspecified back pain laterality  Left shoulder pain    New Prescriptions New Prescriptions   METHOCARBAMOL (ROBAXIN) 500 MG TABLET    Take 1 tablet (500 mg total) by mouth 2 (two) times daily.   NAPROXEN (NAPROSYN) 500 MG TABLET    Take 1 tablet (500 mg total) by mouth 2 (two) times daily with a meal.     Garlon Hatchet, PA-C 10/18/15 1319    Courteney Lyn Mackuen, MD 10/18/15 1523
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
## 2016-09-22 ENCOUNTER — Encounter (HOSPITAL_COMMUNITY): Payer: Self-pay | Admitting: Emergency Medicine

## 2016-09-22 ENCOUNTER — Emergency Department (HOSPITAL_COMMUNITY)
Admission: EM | Admit: 2016-09-22 | Discharge: 2016-09-22 | Disposition: A | Discharge status: Home / Self Care | Payer: Self-pay | Attending: Emergency Medicine | Admitting: Emergency Medicine

## 2016-09-22 ENCOUNTER — Emergency Department (HOSPITAL_COMMUNITY): Payer: Self-pay

## 2016-09-22 DIAGNOSIS — F17210 Nicotine dependence, cigarettes, uncomplicated: Secondary | ICD-10-CM | POA: Insufficient documentation

## 2016-09-22 DIAGNOSIS — X58XXXA Exposure to other specified factors, initial encounter: Secondary | ICD-10-CM | POA: Insufficient documentation

## 2016-09-22 DIAGNOSIS — Y929 Unspecified place or not applicable: Secondary | ICD-10-CM | POA: Insufficient documentation

## 2016-09-22 DIAGNOSIS — Y9389 Activity, other specified: Secondary | ICD-10-CM | POA: Insufficient documentation

## 2016-09-22 DIAGNOSIS — S60512A Abrasion of left hand, initial encounter: Secondary | ICD-10-CM | POA: Insufficient documentation

## 2016-09-22 DIAGNOSIS — Z79899 Other long term (current) drug therapy: Secondary | ICD-10-CM | POA: Insufficient documentation

## 2016-09-22 DIAGNOSIS — Z23 Encounter for immunization: Secondary | ICD-10-CM | POA: Insufficient documentation

## 2016-09-22 DIAGNOSIS — L089 Local infection of the skin and subcutaneous tissue, unspecified: Secondary | ICD-10-CM | POA: Insufficient documentation

## 2016-09-22 DIAGNOSIS — Y990 Civilian activity done for income or pay: Secondary | ICD-10-CM | POA: Insufficient documentation

## 2016-09-22 LAB — BASIC METABOLIC PANEL
Anion gap: 5 (ref 5–15)
BUN: 5 mg/dL — AB (ref 6–20)
CALCIUM: 9.1 mg/dL (ref 8.9–10.3)
CO2: 29 mmol/L (ref 22–32)
Chloride: 102 mmol/L (ref 101–111)
Creatinine, Ser: 1.1 mg/dL (ref 0.61–1.24)
GFR calc Af Amer: >60 mL/min (ref >60)
Glucose, Bld: 91 mg/dL (ref 65–99)
Potassium: 4 mmol/L (ref 3.5–5.1)
SODIUM: 136 mmol/L (ref 135–145)

## 2016-09-22 LAB — CBC WITH DIFFERENTIAL/PLATELET
BASOS ABS: 0 10*3/uL (ref 0.0–0.1)
Basophils Relative: 1 %
EOS ABS: 0.1 10*3/uL (ref 0.0–0.7)
EOS PCT: 2 %
HCT: 41 % (ref 39.0–52.0)
Hemoglobin: 13.8 g/dL (ref 13.0–17.0)
LYMPHS ABS: 1 10*3/uL (ref 0.7–4.0)
Lymphocytes Relative: 23 %
MCH: 29.6 pg (ref 26.0–34.0)
MCHC: 33.7 g/dL (ref 30.0–36.0)
MCV: 87.8 fL (ref 78.0–100.0)
MONO ABS: 0.6 10*3/uL (ref 0.1–1.0)
Monocytes Relative: 13 %
Neutro Abs: 2.7 10*3/uL (ref 1.7–7.7)
Neutrophils Relative %: 61 %
PLATELETS: 204 10*3/uL (ref 150–400)
RBC: 4.67 MIL/uL (ref 4.22–5.81)
RDW: 12 % (ref 11.5–15.5)
WBC: 4.4 10*3/uL (ref 4.0–10.5)

## 2016-09-22 MED ORDER — NAPROXEN 500 MG PO TABS: 500.0000 mg | ORAL_TABLET | Freq: Two times a day (BID) | ORAL | 0 refills | Status: DC

## 2016-09-22 MED ORDER — SULFAMETHOXAZOLE-TRIMETHOPRIM 800-160 MG PO TABS: 1.0000 | ORAL_TABLET | Freq: Two times a day (BID) | ORAL | 0 refills | Status: AC

## 2016-09-22 MED ORDER — CEPHALEXIN 500 MG PO CAPS: 500.0000 mg | ORAL_CAPSULE | Freq: Four times a day (QID) | ORAL | 0 refills | Status: DC

## 2016-09-22 MED ORDER — SULFAMETHOXAZOLE-TRIMETHOPRIM 800-160 MG PO TABS
1.0000 | ORAL_TABLET | Freq: Once | ORAL | Status: AC
  Administered 2016-09-22: 1 via ORAL
  Filled 2016-09-22: qty 1

## 2016-09-22 MED ORDER — TETANUS-DIPHTH-ACELL PERTUSSIS 5-2.5-18.5 LF-MCG/0.5 IM SUSP
0.5000 mL | Freq: Once | INTRAMUSCULAR | Status: AC
  Administered 2016-09-22: 0.5 mL via INTRAMUSCULAR
  Filled 2016-09-22: qty 0.5

## 2016-09-22 MED ORDER — CEFTRIAXONE SODIUM 1 G IJ SOLR
1.0000 g | Freq: Once | INTRAMUSCULAR | Status: AC
  Administered 2016-09-22: 1 g via INTRAMUSCULAR
  Filled 2016-09-22: qty 10

## 2016-09-22 MED ORDER — STERILE WATER FOR INJECTION IJ SOLN
INTRAMUSCULAR | Status: AC
  Filled 2016-09-22: qty 10

## 2016-09-22 NOTE — Discharge Instructions (Signed)
You will need to have the wound rechecked in the next 24 to 48 hours. Return sooner if symptoms worsen

## 2016-09-22 NOTE — ED Triage Notes (Signed)
Pt st;'s he noticed slight swelling in left hand last night.,  Today left hand is red and swollen.  Sm abrasion noted,

## 2016-09-22 NOTE — ED Provider Notes (Signed)
[facility]EM[facility] DEPT Provider Note   CSN: [id number] Arrival date & time: 09/22/16  1528     History   Chief Complaint Chief Complaint  Patient presents with  . Hand Pain    HPI Frank LatheJustin O Jordan is a 31 y.o. male who presents to the ED with pain, swelling and redness to the dorsum of the left hand that started 3 days ago. He also has a wound to the hand that occurred at work piror to the pain and swelling. He is unsure what he cut his hand on. Tetanus unknown.   The history is provided by the patient. No language interpreter was used.  Hand Pain  This is a new problem. The current episode started more than 2 days ago. The problem occurs constantly. The problem has been gradually worsening. Pertinent negatives include no chest pain, no abdominal pain, no headaches and no shortness of breath. Nothing aggravates the symptoms. Nothing relieves the symptoms. He has tried nothing for the symptoms.    History reviewed. No pertinent past medical history.  There are no active problems to display for this patient.   History reviewed. No pertinent surgical history.     Home Medications    Prior to Admission medications   Medication Sig Start Date End Date Taking? Authorizing Provider  cephALEXin (KEFLEX) 500 MG capsule Take 1 capsule (500 mg total) by mouth 4 (four) times daily. 09/22/16   Janne NapoleonNeese, Arita Severtson M, NP  methocarbamol (ROBAXIN) 500 MG tablet Take 1 tablet (500 mg total) by mouth 2 (two) times daily. 10/18/15   Garlon HatchetSanders, Lisa M, PA-C  naproxen (NAPROSYN) 500 MG tablet Take 1 tablet (500 mg total) by mouth 2 (two) times daily. 09/22/16   Janne NapoleonNeese, Hyder Deman M, NP  sulfamethoxazole-trimethoprim (BACTRIM DS,SEPTRA DS) 800-160 MG tablet Take 1 tablet by mouth 2 (two) times daily. 09/22/16 10/02/16  Janne NapoleonNeese, Turquoise Esch M, NP    Family History No family history on file.  Social History Social History  Substance Use Topics  . Smoking status: Current Every Day Smoker    Packs/day: 0.50    Years: 5.00    Types:  Cigarettes  . Smokeless tobacco: Never Used  . Alcohol use Yes     Allergies   Patient has no known allergies.   Review of Systems Review of Systems  Constitutional: Negative for chills and fever.  HENT: Negative for sore throat and trouble swallowing.   Eyes: Negative for discharge, redness and visual disturbance.  Respiratory: Negative for chest tightness, shortness of breath and wheezing.   Cardiovascular: Negative for chest pain.  Gastrointestinal: Negative for abdominal pain, nausea and vomiting.  Musculoskeletal: Positive for arthralgias.       Left hand  Skin: Positive for wound.  Neurological: Negative for headaches.  Psychiatric/Behavioral: The patient is not nervous/anxious.      Physical Exam Updated Vital Signs BP 132/82 (BP Location: Left Arm)   Pulse 60   Temp 98.1 F (36.7 C) (Oral)   Resp 18   Ht 6' (1.829 m)   Wt 72.6 kg (160 lb)   SpO2 100%   BMI 21.70 kg/m   Physical Exam  Constitutional: He appears well-developed and well-nourished. No distress.  HENT:  Head: Normocephalic and atraumatic.  Eyes: EOM are normal.  Neck: Neck supple.  Cardiovascular: Normal rate.   Pulmonary/Chest: Effort normal.  Abdominal: There is no tenderness.  Musculoskeletal:       Left hand: He exhibits tenderness, laceration and swelling. He exhibits normal capillary refill. Decreased range  of motion: due to swelling and pain. Normal sensation noted. Normal strength noted. He exhibits no thumb/finger opposition.       Hands: Neurological: He is alert.  Skin: Skin is warm and dry.  Psychiatric: He has a normal mood and affect.  Nursing note and vitals reviewed.    ED Treatments / Results  Labs (all labs ordered are listed, but only abnormal results are displayed) Labs Reviewed  BASIC METABOLIC PANEL - Abnormal; Notable for the following:       Result Value   BUN 5 (*)    All other components within normal limits  CBC WITH DIFFERENTIAL/PLATELET    Radiology Dg Hand Complete Left  Result Date: 09/22/2016 CLINICAL DATA:  Pain and swelling with redness for 2 days EXAM: LEFT HAND - COMPLETE 3+ VIEW COMPARISON:  None. FINDINGS: Frontal, oblique, and lateral views were obtained. There is mild soft tissue swelling. There is no fracture or dislocation. Joint spaces appear normal. No erosive change or bony destruction. No soft tissue air. IMPRESSION: Mild soft tissue swelling. No fracture or dislocation. No erosive change or bony destruction. No appreciable arthropathy. Electronically Signed   By: Bretta Bang III M.D.   On: 09/22/2016 19:02    Procedures Procedures (including critical care time)  Medications Ordered in ED Medications  sterile water (preservative free) injection (not administered)  Tdap (BOOSTRIX) injection 0.5 mL (not administered)  sulfamethoxazole-trimethoprim (BACTRIM DS,SEPTRA DS) 800-160 MG per tablet 1 tablet (not administered)  cefTRIAXone (ROCEPHIN) injection 1 g (1 g Intramuscular Given 09/22/16 2001)     Initial Impression / Assessment and Plan / ED Course  I have reviewed the triage vital signs and the nursing notes.  Pertinent labs & imaging results that were available during my care of the patient were reviewed by me and considered in my medical decision making (see chart for details).  Final Clinical Impressions(s) / ED Diagnoses  31 y.o. male with swelling, tenderness, erythema and wound to the dorsum of the left hand stable for d/c without focal neuro deficits, no acute findings on x-ray, normal labs. Discussed with the patient in detail need for follow up in the next 24 to 48 hours for recheck or sooner for worsening symptoms. Patient agrees with plan. Tetanus updated prior to d/c.  Final diagnoses:  Infected abrasion of left hand, initial encounter    New Prescriptions New Prescriptions   CEPHALEXIN (KEFLEX) 500 MG CAPSULE    Take 1 capsule (500 mg total) by mouth 4 (four) times daily.    NAPROXEN (NAPROSYN) 500 MG TABLET    Take 1 tablet (500 mg total) by mouth 2 (two) times daily.   SULFAMETHOXAZOLE-TRIMETHOPRIM (BACTRIM DS,SEPTRA DS) 800-160 MG TABLET    Take 1 tablet by mouth 2 (two) times daily.     Kerrie Buffalo [place], [place] 09/22/16 2037    Laurence Spates, MD 09/24/16 [phone]

## 2016-09-22 NOTE — ED Notes (Signed)
Pt transported to Xray. 

## 2017-11-19 ENCOUNTER — Emergency Department (HOSPITAL_COMMUNITY)
Admission: EM | Admit: 2017-11-19 | Discharge: 2017-11-19 | Disposition: A | Discharge status: Home / Self Care | Payer: Self-pay | Attending: Emergency Medicine | Admitting: Emergency Medicine

## 2017-11-19 ENCOUNTER — Other Ambulatory Visit: Payer: Self-pay

## 2017-11-19 DIAGNOSIS — F17210 Nicotine dependence, cigarettes, uncomplicated: Secondary | ICD-10-CM | POA: Insufficient documentation

## 2017-11-19 DIAGNOSIS — R369 Urethral discharge, unspecified: Secondary | ICD-10-CM | POA: Insufficient documentation

## 2017-11-19 DIAGNOSIS — Z79899 Other long term (current) drug therapy: Secondary | ICD-10-CM | POA: Insufficient documentation

## 2017-11-19 DIAGNOSIS — Z711 Person with feared health complaint in whom no diagnosis is made: Secondary | ICD-10-CM | POA: Insufficient documentation

## 2017-11-19 LAB — URINALYSIS, ROUTINE W REFLEX MICROSCOPIC
BILIRUBIN URINE: NEGATIVE
Glucose, UA: NEGATIVE mg/dL
Hgb urine dipstick: NEGATIVE
Ketones, ur: NEGATIVE mg/dL
Nitrite: NEGATIVE
Protein, ur: NEGATIVE mg/dL
SPECIFIC GRAVITY, URINE: 1.023 (ref 1.005–1.030)
WBC, UA: >50 WBC/hpf — ABNORMAL HIGH (ref 0–5)
pH: 7 (ref 5.0–8.0)

## 2017-11-19 MED ORDER — LIDOCAINE HCL (PF) 1 % IJ SOLN
INTRAMUSCULAR | Status: AC
  Filled 2017-11-19: qty 5

## 2017-11-19 MED ORDER — AZITHROMYCIN 250 MG PO TABS
1000.0000 mg | ORAL_TABLET | Freq: Once | ORAL | Status: AC
  Administered 2017-11-19: 1000 mg via ORAL
  Filled 2017-11-19: qty 4

## 2017-11-19 MED ORDER — LIDOCAINE HCL (PF) 1 % IJ SOLN
0.9000 mL | Freq: Once | INTRAMUSCULAR | Status: AC
  Administered 2017-11-19: 0.9 mL

## 2017-11-19 MED ORDER — CEFTRIAXONE SODIUM 250 MG IJ SOLR
250.0000 mg | Freq: Once | INTRAMUSCULAR | Status: AC
  Administered 2017-11-19: 250 mg via INTRAMUSCULAR
  Filled 2017-11-19: qty 250

## 2017-11-19 MED ORDER — CEFTRIAXONE SODIUM 250 MG IJ SOLR
250.0000 mg | Freq: Once | INTRAMUSCULAR | Status: DC
  Filled 2017-11-19: qty 250

## 2017-11-19 NOTE — Discharge Instructions (Addendum)
You have been tested for STDs. Some of these results are still pending. Any abnormalities will be called to you. You have been empirically treated for gonorrhea and chlamydia. This does not mean you necessarily have these diseases, treatment is precautionary. Be sure to follow safe sex practices, including monogamy and/or condom use. All sexual partners must also be notified and treated.  Any future STD testing or treatment should be performed at the [facility] department or primary care office. For the treatment to be fully effective, avoid all sexual contact for at least 2 weeks after medication administration. Otherwise, you will infect your partner and/or risk reinfecting yourself. Should symptoms fail to improve within 1 week, follow up with a primary care provider or go to the [facility].

## 2017-11-19 NOTE — ED Triage Notes (Signed)
Pt sts that he has penile discharge for several days. Pt denies any rash or bumps. Pt only has one partner with unprotected sexual encounters.

## 2017-11-19 NOTE — ED Provider Notes (Signed)
[facility] EMERGENCY DEPARTMENT Provider Note   CSN: [id number] Arrival date & time: 11/19/17  0846     History   Chief Complaint Chief Complaint  Patient presents with  . Penile Discharge    HPI Frank Jordan is a 32 y.o. male.  HPI   Frank Jordan is a 32 y.o. male, with a history of gonorrhea, presenting to the ED with penile discharge for about the last 6 days.  Accompanied by dysuria.  He is sexually active with one male partner. Denies fever/chills, nausea/vomiting, abdominal pain, testicular pain or swelling, genital lesions, or any other complaints.     No past medical history on file.  There are no active problems to display for this patient.   No past surgical history on file.      Home Medications    Prior to Admission medications   Medication Sig Start Date End Date Taking? Authorizing Provider  cephALEXin (KEFLEX) 500 MG capsule Take 1 capsule (500 mg total) by mouth 4 (four) times daily. 09/22/16   Janne Napoleon, NP  methocarbamol (ROBAXIN) 500 MG tablet Take 1 tablet (500 mg total) by mouth 2 (two) times daily. 10/18/15   Garlon Hatchet, PA-C  naproxen (NAPROSYN) 500 MG tablet Take 1 tablet (500 mg total) by mouth 2 (two) times daily. 09/22/16   Janne Napoleon, NP    Family History No family history on file.  Social History Social History   Tobacco Use  . Smoking status: Current Every Day Smoker    Packs/day: 0.50    Years: 5.00    Pack years: 2.50    Types: Cigarettes  . Smokeless tobacco: Never Used  Substance Use Topics  . Alcohol use: Yes  . Drug use: Yes    Types: Marijuana     Allergies   Patient has no known allergies.   Review of Systems Review of Systems  Constitutional: Negative for chills and fever.  Gastrointestinal: Negative for abdominal pain, nausea and vomiting.  Genitourinary: Positive for discharge and dysuria. Negative for flank pain, penile swelling, scrotal swelling and testicular pain.    Musculoskeletal: Negative for back pain.     Physical Exam Updated Vital Signs BP (!) 143/98 (BP Location: Right Arm)   Pulse 98   Temp 97.8 F (36.6 C) (Oral)   Ht 6' (1.829 m)   Wt 73.5 kg   SpO2 100%   BMI 21.97 kg/m   Physical Exam  Constitutional: He appears well-developed and well-nourished. No distress.  HENT:  Head: Normocephalic and atraumatic.  Eyes: Conjunctivae are normal.  Neck: Neck supple.  Cardiovascular: Normal rate and regular rhythm.  Pulmonary/Chest: Effort normal.  Abdominal: Soft. There is no tenderness. There is no guarding.  Genitourinary: No penile tenderness.  Genitourinary Comments: Penis, scrotum, and testicles without swelling, lesions, or tenderness.  Yellow tinted penile discharge noted at the urethral meatus.  No inguinal lymphadenopathy. Otherwise normal male genitalia. Patient refused to allow females in room. No male chaperone available.  Neurological: He is alert.  Skin: Skin is warm and dry. He is not diaphoretic. No pallor.  Psychiatric: He has a normal mood and affect. His behavior is normal.  Nursing note and vitals reviewed.    ED Treatments / Results  Labs (all labs ordered are listed, but only abnormal results are displayed) Labs Reviewed  URINALYSIS, ROUTINE W REFLEX MICROSCOPIC - Abnormal; Notable for the following components:      Result Value   APPearance HAZY (*)  Leukocytes, UA MODERATE (*)    WBC, UA >50 (*)    Bacteria, UA FEW (*)    All other components within normal limits  URINE CULTURE  RPR  HIV ANTIBODY (ROUTINE TESTING)  GC/CHLAMYDIA PROBE AMP ([place]) NOT AT [facility]    EKG None  Radiology No results found.  Procedures Procedures (including critical care time)  Medications Ordered in ED Medications  cefTRIAXone (ROCEPHIN) injection 250 mg (250 mg Intramuscular Given 11/19/17 0953)  azithromycin (ZITHROMAX) tablet 1,000 mg (1,000 mg Oral Given 11/19/17 0936)  lidocaine (PF) (XYLOCAINE) 1 %  injection 0.9 mL (0.9 mLs Other Given 11/19/17 0953)     Initial Impression / Assessment and Plan / ED Course  I have reviewed the triage vital signs and the nursing notes.  Pertinent labs & imaging results that were available during my care of the patient were reviewed by me and considered in my medical decision making (see chart for details).      Patient is afebrile without abdominal tenderness, abdominal pain or painful bowel movements to indicate prostatitis.  No tenderness to palpation of the testes or epididymis to suggest orchitis or epididymitis.  STD labs obtained including HIV, RPR, gonorrhea and chlamydia. Patient discharged with instructions to follow up with PCP or go to the county health department for future STD testing. Discussed importance of using protection when sexually active, in addition to other safe sex practices. Pt understands that they have GC/Chlamydia cultures pending and that they will need to inform all sexual partners if results return positive. Patient has been treated empirically with azithromycin and Rocephin   Final Clinical Impressions(s) / ED Diagnoses   Final diagnoses:  Penile discharge  Concern about STD in male without diagnosis    ED Discharge Orders    None       Concepcion Living 11/19/17 1530    Wynetta Fines, MD 11/21/17 1113

## 2017-11-19 NOTE — ED Notes (Signed)
ED Provider at bedside. 

## 2017-11-19 NOTE — ED Notes (Signed)
Pt requesting male provider. Harolyn Rutherford, PA at bedside.

## 2017-11-19 NOTE — ED Notes (Signed)
Pt verbalized understanding of discharge instructions and denies any further questions at this time.   

## 2017-11-20 LAB — RPR: RPR: NONREACTIVE

## 2017-11-20 LAB — URINE CULTURE: Culture: NO GROWTH

## 2017-11-20 LAB — HIV ANTIBODY (ROUTINE TESTING W REFLEX): HIV Screen 4th Generation wRfx: NONREACTIVE

## 2017-11-21 LAB — GC/CHLAMYDIA PROBE AMP (~~LOC~~) NOT AT ARMC
Chlamydia: NEGATIVE
Neisseria Gonorrhea: POSITIVE — AB

## 2019-01-02 IMAGING — CR DG HAND COMPLETE 3+V*L*
3 series · 3 of 3 positions shown · non-contrast
Comparison: None.

CLINICAL DATA: Pain and swelling with redness for 2 days

EXAM:
LEFT HAND - COMPLETE 3+ VIEW

[hand pa]
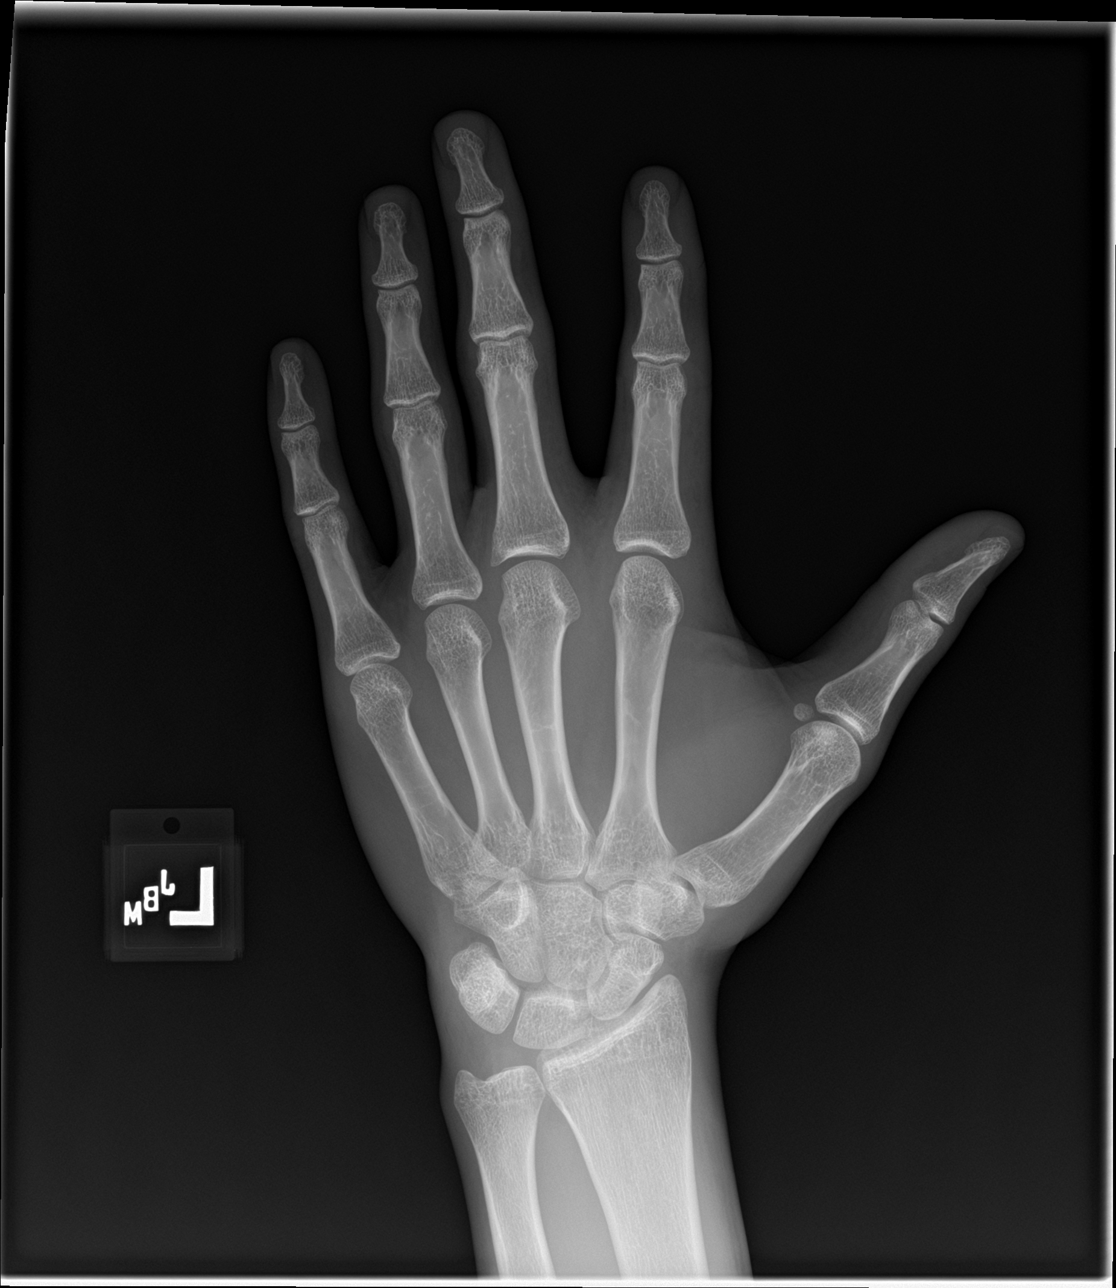

[hand obl]
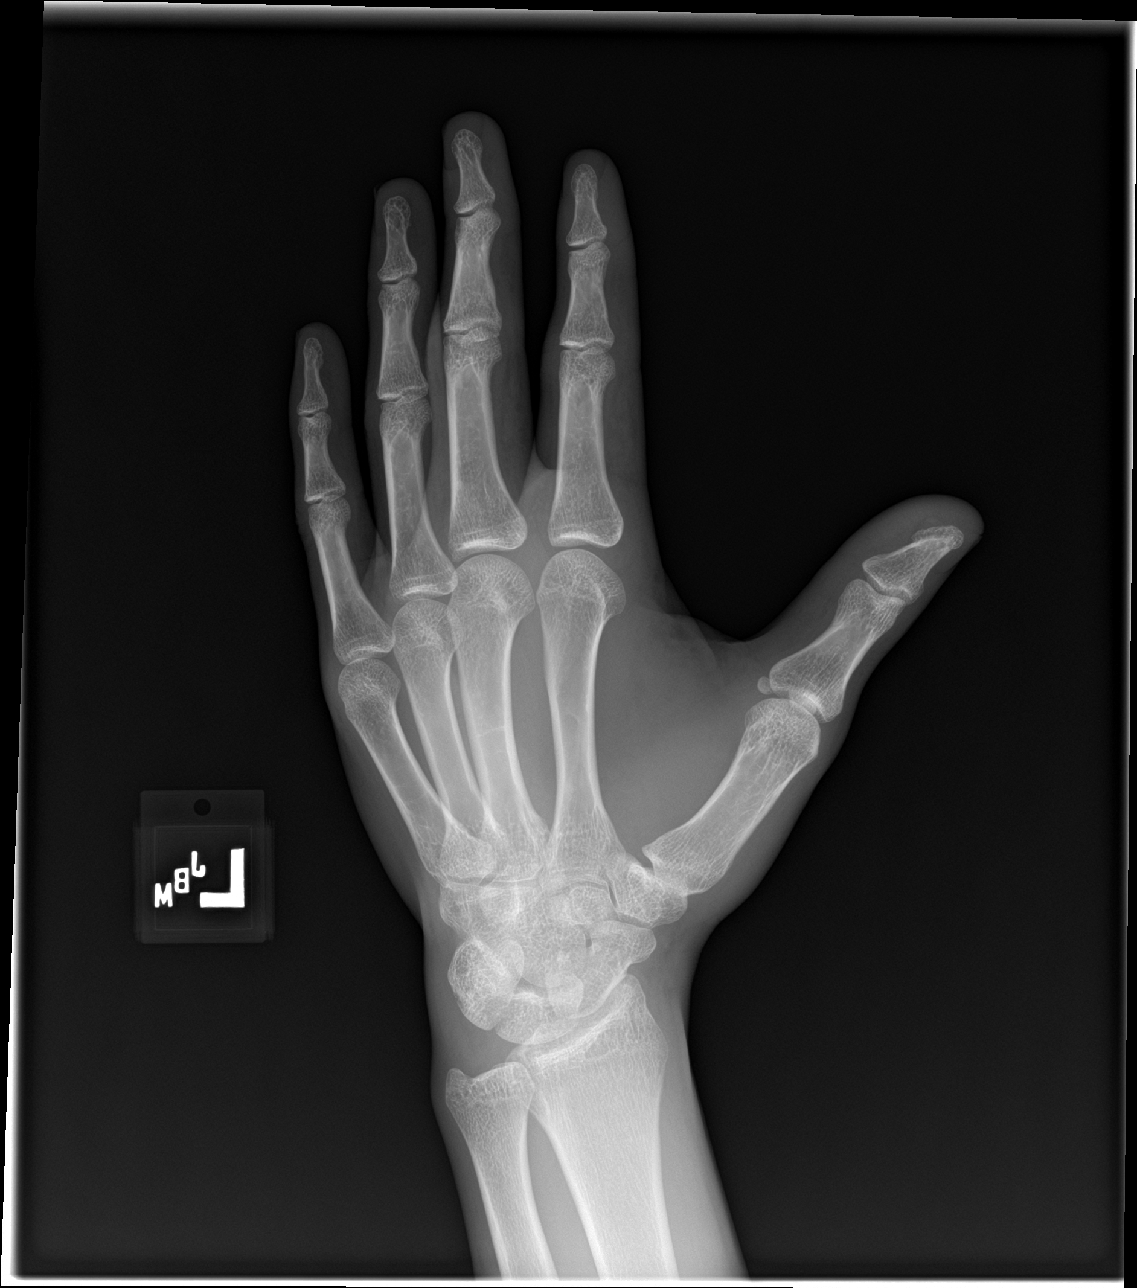

[hand lat]
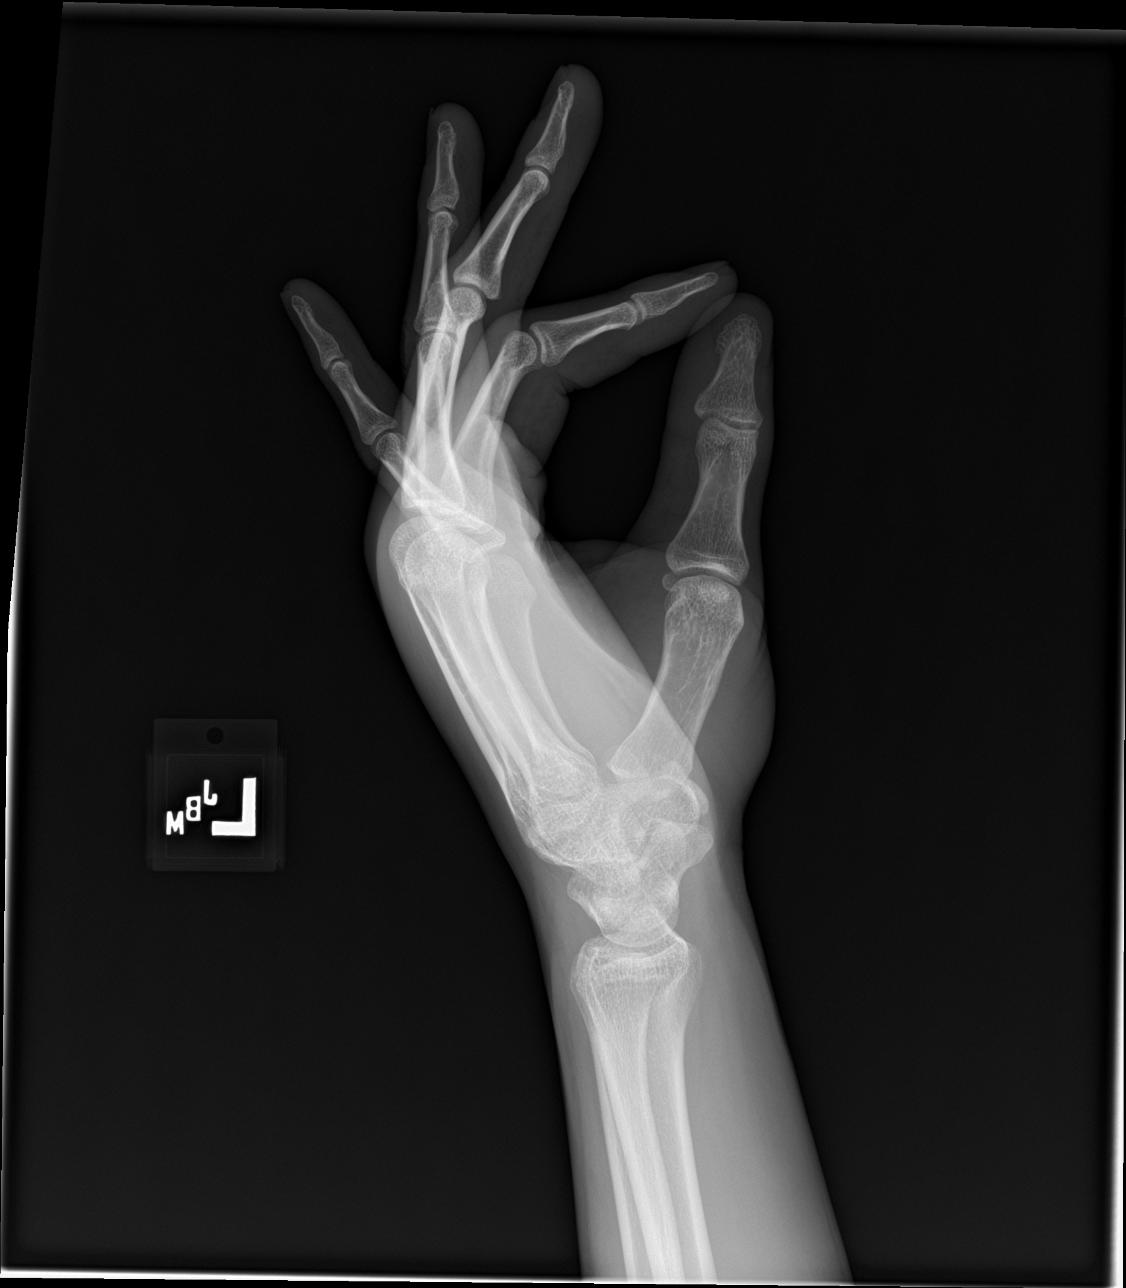

[3 of 3 positions shown; findings below may reference images not displayed]

FINDINGS: Frontal, oblique, and lateral views were obtained. There is mild
soft tissue swelling. There is no fracture or dislocation. Joint
spaces appear normal. No erosive change or bony destruction. No soft
tissue air.
IMPRESSION: Mild soft tissue swelling. No fracture or dislocation. No erosive
change or bony destruction. No appreciable arthropathy.

## 2021-02-02 ENCOUNTER — Encounter (HOSPITAL_COMMUNITY): Payer: Self-pay | Admitting: Emergency Medicine

## 2021-02-02 ENCOUNTER — Emergency Department (HOSPITAL_COMMUNITY)
Admission: EM | Admit: 2021-02-02 | Discharge: 2021-02-03 | Disposition: A | Discharge status: Home / Self Care | Payer: Self-pay | Attending: Emergency Medicine | Admitting: Emergency Medicine

## 2021-02-02 ENCOUNTER — Other Ambulatory Visit: Payer: Self-pay

## 2021-02-02 DIAGNOSIS — Z5321 Procedure and treatment not carried out due to patient leaving prior to being seen by health care provider: Secondary | ICD-10-CM | POA: Insufficient documentation

## 2021-02-02 DIAGNOSIS — R369 Urethral discharge, unspecified: Secondary | ICD-10-CM | POA: Insufficient documentation

## 2021-02-02 LAB — URINALYSIS, ROUTINE W REFLEX MICROSCOPIC
Bilirubin Urine: NEGATIVE
Glucose, UA: NEGATIVE mg/dL
Hgb urine dipstick: NEGATIVE
Ketones, ur: NEGATIVE mg/dL
Nitrite: NEGATIVE
Protein, ur: NEGATIVE mg/dL
Specific Gravity, Urine: 1.02 (ref 1.005–1.030)
WBC, UA: >50 WBC/hpf — ABNORMAL HIGH (ref 0–5)
pH: 6 (ref 5.0–8.0)

## 2021-02-02 LAB — HIV ANTIBODY (ROUTINE TESTING W REFLEX): HIV Screen 4th Generation wRfx: NONREACTIVE

## 2021-02-02 NOTE — ED Triage Notes (Signed)
Pt presents to ED POV. Pt c/o penile discharge. Has been having unprotected sex.

## 2021-02-02 NOTE — ED Notes (Signed)
Pt name called for vitals x2; no response

## 2021-02-02 NOTE — ED Notes (Signed)
NA for vitals x4.

## 2021-02-02 NOTE — ED Provider Notes (Signed)
Emergency Medicine Provider Triage Evaluation Note  STARK AGUINAGA , a 35 y.o. male  was evaluated in triage.  Pt complains of penile discharge.  Review of Systems  Positive: Penile discharge Negative: fevers  Physical Exam  BP (!) 132/91 (BP Location: Left Arm)   Pulse 78   Temp 98.1 F (36.7 C) (Oral)   Resp 16   Ht 6' (1.829 m)   Wt 72.6 kg   SpO2 99%   BMI 21.70 kg/m  Gen:   Awake, no distress   Resp:  Normal effort  MSK:   Moves extremities without difficulty   Medical Decision Making  Medically screening exam initiated at 4:06 PM.  Appropriate orders placed.  WALDO DAMIAN was informed that the remainder of the evaluation will be completed by another provider, this initial triage assessment does not replace that evaluation, and the importance of remaining in the ED until their evaluation is complete.     Karrie Meres, PA-C 02/02/21 1606    Pollyann Savoy, MD 02/03/21 [phone]

## 2021-02-03 LAB — RPR: RPR Ser Ql: NONREACTIVE

## 2021-02-08 ENCOUNTER — Other Ambulatory Visit: Payer: Self-pay

## 2021-02-08 ENCOUNTER — Encounter (HOSPITAL_COMMUNITY): Payer: Self-pay

## 2021-02-08 ENCOUNTER — Emergency Department (HOSPITAL_COMMUNITY)
Admission: EM | Admit: 2021-02-08 | Discharge: 2021-02-08 | Disposition: A | Discharge status: Home / Self Care | Payer: Self-pay | Attending: Emergency Medicine | Admitting: Emergency Medicine

## 2021-02-08 DIAGNOSIS — F17210 Nicotine dependence, cigarettes, uncomplicated: Secondary | ICD-10-CM | POA: Insufficient documentation

## 2021-02-08 DIAGNOSIS — A64 Unspecified sexually transmitted disease: Secondary | ICD-10-CM | POA: Insufficient documentation

## 2021-02-08 DIAGNOSIS — N342 Other urethritis: Secondary | ICD-10-CM

## 2021-02-08 DIAGNOSIS — N341 Nonspecific urethritis: Secondary | ICD-10-CM | POA: Insufficient documentation

## 2021-02-08 DIAGNOSIS — R369 Urethral discharge, unspecified: Secondary | ICD-10-CM

## 2021-02-08 MED ORDER — CEFTRIAXONE SODIUM 1 G IJ SOLR
500.0000 mg | Freq: Once | INTRAMUSCULAR | Status: AC
  Administered 2021-02-08: 500 mg via INTRAMUSCULAR
  Filled 2021-02-08: qty 10

## 2021-02-08 MED ORDER — AZITHROMYCIN 250 MG PO TABS
1000.0000 mg | ORAL_TABLET | Freq: Once | ORAL | Status: AC
  Administered 2021-02-08: 1000 mg via ORAL
  Filled 2021-02-08: qty 4

## 2021-02-08 MED ORDER — METRONIDAZOLE 500 MG PO TABS: 500.0000 mg | ORAL_TABLET | Freq: Two times a day (BID) | ORAL | 0 refills | Status: DC

## 2021-02-08 MED ORDER — LIDOCAINE HCL (PF) 1 % IJ SOLN
1.0000 mL | Freq: Once | INTRAMUSCULAR | Status: AC
Start: 2021-02-08 — End: 2021-02-08
  Administered 2021-02-08: 1 mL

## 2021-02-08 MED ORDER — STERILE WATER FOR INJECTION IJ SOLN
INTRAMUSCULAR | Status: AC
  Filled 2021-02-08: qty 10

## 2021-02-08 NOTE — Discharge Instructions (Addendum)
Start taking the antibiotics that are prescribed 48 hours after you have stopped using alcohol.

## 2021-02-08 NOTE — ED Provider Notes (Signed)
[facility]-EMERGENCY DEPT Provider Note   CSN: [id number] Arrival date & time: 02/08/21  1142     History Chief Complaint  Patient presents with   Exposure to STD    Frank Jordan is a 35 y.o. male.  HPI     35 year old male comes in with chief complaint of penile discharge.  Patient reports that he has been having some discomfort with urination for the past few days.  Over the last week he also has had some penile discharge.  He went to [facility] on 11-17, had blood draw but was not seen or treated for his symptoms.  History reviewed. No pertinent past medical history.  There are no problems to display for this patient.   History reviewed. No pertinent surgical history.     History reviewed. No pertinent family history.  Social History   Tobacco Use   Smoking status: Every Day    Packs/day: 0.50    Years: 5.00    Pack years: 2.50    Types: Cigarettes   Smokeless tobacco: Never  Substance Use Topics   Alcohol use: Yes   Drug use: Yes    Types: Marijuana    Home Medications Prior to Admission medications   Medication Sig Start Date End Date Taking? Authorizing Provider  metroNIDAZOLE (FLAGYL) 500 MG tablet Take 1 tablet (500 mg total) by mouth 2 (two) times daily. 02/08/21  Yes Laddie Naeem, MD  cephALEXin (KEFLEX) 500 MG capsule Take 1 capsule (500 mg total) by mouth 4 (four) times daily. 09/22/16   Janne Napoleon, NP  methocarbamol (ROBAXIN) 500 MG tablet Take 1 tablet (500 mg total) by mouth 2 (two) times daily. 10/18/15   Garlon Hatchet, PA-C  naproxen (NAPROSYN) 500 MG tablet Take 1 tablet (500 mg total) by mouth 2 (two) times daily. 09/22/16   Janne Napoleon, NP    Allergies    Patient has no known allergies.  Review of Systems   Review of Systems  Constitutional:  Positive for activity change.  Genitourinary:  Positive for penile discharge.   Physical Exam Updated Vital Signs BP 131/83 (BP Location: Left Arm)   Pulse 87    Temp 97.8 F (36.6 C) (Oral)   Resp 17   SpO2 98%   Physical Exam Vitals and nursing note reviewed.  Constitutional:      Appearance: He is well-developed.  HENT:     Head: Atraumatic.  Cardiovascular:     Rate and Rhythm: Normal rate.  Pulmonary:     Effort: Pulmonary effort is normal.  Genitourinary:    Comments: Purulent discharge at the meatus, no rash Musculoskeletal:     Cervical back: Neck supple.  Skin:    General: Skin is warm.  Neurological:     Mental Status: He is alert.    ED Results / Procedures / Treatments   Labs (all labs ordered are listed, but only abnormal results are displayed) Labs Reviewed  GC/CHLAMYDIA PROBE AMP ([place]) NOT AT [facility]    EKG None  Radiology No results found.  Procedures Procedures   Medications Ordered in ED Medications  azithromycin (ZITHROMAX) tablet 1,000 mg (has no administration in time range)  cefTRIAXone (ROCEPHIN) injection 500 mg (has no administration in time range)  lidocaine (PF) (XYLOCAINE) 1 % injection 1 mL (has no administration in time range)  sterile water (preservative free) injection (has no administration in time range)    ED Course  I have reviewed the triage  vital signs and the nursing notes.  Pertinent labs & imaging results that were available during my care of the patient were reviewed by me and considered in my medical decision making (see chart for details).    MDM Rules/Calculators/A&P                           35 year old male comes in with chief complaint of burning with urination and penile discharge.  He indicates that the symptoms started after having sex with a woman who claimed to have UTI.  Noted to have penile discharge.  His last visit, he had RPR and HIV screening test sent which are negative, results discussed with him.  We will give him Rocephin and azithromycin.  Patient plans to drink tonight, Flagyl prescription given and advised patient to start taking it 48 hours  after he has been sober.  Final Clinical Impression(s) / ED Diagnoses Final diagnoses:  Penile discharge  STI (sexually transmitted infection)  Urethritis    Rx / DC Orders ED Discharge Orders          Ordered    metroNIDAZOLE (FLAGYL) 500 MG tablet  2 times daily        02/08/21 1301             Derwood Kaplan, MD 02/08/21 1304

## 2021-02-08 NOTE — ED Notes (Signed)
An After Visit Summary was printed and given to the patient. Discharge instructions given and no further questions at this time.  

## 2021-02-08 NOTE — ED Triage Notes (Signed)
Pt c/o painful discharge when urinating, and when not urinating. Pt states he has had unprotected sex. Pt states this has been going on for a week and half. Pt left AMA from [facility] 11/17 for same problem, pt states they were taking too long.

## 2021-02-10 LAB — GC/CHLAMYDIA PROBE AMP (~~LOC~~) NOT AT ARMC
Chlamydia: NEGATIVE
Comment: NEGATIVE
Comment: NORMAL
Neisseria Gonorrhea: POSITIVE — AB

## 2022-12-04 ENCOUNTER — Other Ambulatory Visit: Payer: Self-pay

## 2022-12-04 ENCOUNTER — Emergency Department (HOSPITAL_COMMUNITY)
Admission: EM | Admit: 2022-12-04 | Discharge: 2022-12-05 | Disposition: A | Discharge status: Home / Self Care | Payer: 59 | Attending: Emergency Medicine | Admitting: Emergency Medicine

## 2022-12-04 DIAGNOSIS — J180 Bronchopneumonia, unspecified organism: Secondary | ICD-10-CM | POA: Diagnosis not present

## 2022-12-04 DIAGNOSIS — R059 Cough, unspecified: Secondary | ICD-10-CM | POA: Diagnosis not present

## 2022-12-04 DIAGNOSIS — R0989 Other specified symptoms and signs involving the circulatory and respiratory systems: Secondary | ICD-10-CM | POA: Diagnosis not present

## 2022-12-04 DIAGNOSIS — Z1152 Encounter for screening for COVID-19: Secondary | ICD-10-CM | POA: Diagnosis not present

## 2022-12-04 DIAGNOSIS — R058 Other specified cough: Secondary | ICD-10-CM | POA: Diagnosis not present

## 2022-12-04 DIAGNOSIS — R918 Other nonspecific abnormal finding of lung field: Secondary | ICD-10-CM | POA: Diagnosis not present

## 2022-12-04 NOTE — ED Triage Notes (Signed)
Patient reports productive cough with runny nose this week , respirations unlabored.

## 2022-12-05 ENCOUNTER — Emergency Department (HOSPITAL_COMMUNITY): Payer: 59

## 2022-12-05 DIAGNOSIS — R058 Other specified cough: Secondary | ICD-10-CM | POA: Diagnosis not present

## 2022-12-05 DIAGNOSIS — J180 Bronchopneumonia, unspecified organism: Secondary | ICD-10-CM | POA: Diagnosis not present

## 2022-12-05 DIAGNOSIS — R918 Other nonspecific abnormal finding of lung field: Secondary | ICD-10-CM | POA: Diagnosis not present

## 2022-12-05 DIAGNOSIS — R0989 Other specified symptoms and signs involving the circulatory and respiratory systems: Secondary | ICD-10-CM | POA: Diagnosis not present

## 2022-12-05 LAB — RESP PANEL BY RT-PCR (RSV, FLU A&B, COVID)  RVPGX2
Influenza A by PCR: NEGATIVE
Influenza B by PCR: NEGATIVE
Resp Syncytial Virus by PCR: NEGATIVE
SARS Coronavirus 2 by RT PCR: NEGATIVE

## 2022-12-05 MED ORDER — AMOXICILLIN-POT CLAVULANATE 875-125 MG PO TABS: 1.0000 | ORAL_TABLET | Freq: Two times a day (BID) | ORAL | 0 refills | Status: DC

## 2022-12-05 MED ORDER — PROMETHAZINE-DM 6.25-15 MG/5ML PO SYRP: 5.0000 mL | ORAL_SOLUTION | Freq: Four times a day (QID) | ORAL | 0 refills | Status: DC | PRN

## 2022-12-05 NOTE — Discharge Instructions (Addendum)
As we discussed, your x-ray shows that you have pneumonia in both sides of your lungs.  For this I have given you a prescription for Augmentin which is an antibiotic that it is extremely important you fill and take as prescribed in its entirety.  I have also given you promethazine cough syrup which is a cough suppressant medication for you to take as prescribed as needed.  Do not drive or operate heavy machinery while taking this medication as it can be sedating.  Additionally, given the findings on your x-ray you do need to have a repeat x-ray in the next few weeks to ensure that the pneumonia has resolved.  I recommend establishing care with a primary doctor, however if you are unable to I have given you a referral to the wellness center with a number to call to schedule an appointment.  They can act as your primary doctor while you are trying to find 1.  It is very important that you limit your smoking as much as possible while your lungs are trying to recover from this infection.  If your symptoms have not improved in the next 48 to 72 hours or you have any shortness of breath or any new or worsening symptoms, I would strongly suggest you return for additional evaluation and management.

## 2022-12-05 NOTE — ED Provider Notes (Signed)
[place] EMERGENCY DEPARTMENT AT [facility] Provider Note   CSN: [id number] Arrival date & time: 12/04/22  1842     History  Chief Complaint  Patient presents with   Cough    Frank Jordan is a 37 y.o. male.  Patient with no pertinent past medical history presents today with complaints of cough and congestion.  He states that same has been ongoing for the past few days. His cough is productive of yellow sputum. Notes that his friend has been sick with similar symptoms. Denies fevers, chills, nausea, vomiting, bodyaches, or headaches. No chest pain or shortness of breath. He has not tried anything for his symptoms. He does note that he smokes about 1 pack a day as well as vaping and smoking marijuana daily as well.   The history is provided by the patient. No language interpreter was used.  Cough      Home Medications Prior to Admission medications   Medication Sig Start Date End Date Taking? Authorizing Provider  cephALEXin (KEFLEX) 500 MG capsule Take 1 capsule (500 mg total) by mouth 4 (four) times daily. 09/22/16   Janne Napoleon, NP  methocarbamol (ROBAXIN) 500 MG tablet Take 1 tablet (500 mg total) by mouth 2 (two) times daily. 10/18/15   Garlon Hatchet, PA-C  metroNIDAZOLE (FLAGYL) 500 MG tablet Take 1 tablet (500 mg total) by mouth 2 (two) times daily. 02/08/21   Derwood Kaplan, MD  naproxen (NAPROSYN) 500 MG tablet Take 1 tablet (500 mg total) by mouth 2 (two) times daily. 09/22/16   Janne Napoleon, NP      Allergies    Patient has no known allergies.    Review of Systems   Review of Systems  HENT:  Positive for congestion.   Respiratory:  Positive for cough.   All other systems reviewed and are negative.   Physical Exam Updated Vital Signs BP (!) 132/93 (BP Location: Right Arm)   Pulse 83   Temp 98.5 F (36.9 C)   Resp 17   SpO2 99%  Physical Exam Vitals and nursing note reviewed.  Constitutional:      General: He is not in acute distress.     Appearance: Normal appearance. He is normal weight. He is not ill-appearing, toxic-appearing or diaphoretic.     Comments: Patient resting comfortably laying flat in bed in no acute distress  HENT:     Head: Normocephalic and atraumatic.  Cardiovascular:     Rate and Rhythm: Normal rate and regular rhythm.     Heart sounds: Normal heart sounds.  Pulmonary:     Effort: Pulmonary effort is normal. No respiratory distress.     Breath sounds: Normal breath sounds.     Comments: Speaking in complete sentences Abdominal:     General: Abdomen is flat.     Palpations: Abdomen is soft.     Tenderness: There is no abdominal tenderness.  Musculoskeletal:        General: Normal range of motion.     Cervical back: Normal range of motion.  Skin:    General: Skin is warm and dry.  Neurological:     General: No focal deficit present.     Mental Status: He is alert.  Psychiatric:        Mood and Affect: Mood normal.        Behavior: Behavior normal.     ED Results / Procedures / Treatments   Labs (all labs ordered are listed, but  only abnormal results are displayed) Labs Reviewed  RESP PANEL BY RT-PCR (RSV, FLU A&B, COVID)  [id number]    EKG None  Radiology DG Chest 2 View  Result Date: 12/05/2022 CLINICAL DATA:  Productive cough with runny nose EXAM: CHEST - 2 VIEW COMPARISON:  None Available. FINDINGS: Hyperinflation with bullous change in the right upper lobe. Mid lung predominant hazy airspace and reticulonodular opacities. More dense consolidation in the right upper lobe. No pleural effusion or pneumothorax. Normal cardiomediastinal silhouette. No displaced rib fractures. IMPRESSION: Bilateral bronchopneumonia. Dense consolidation in the right upper lobe is likely due to pneumonia however follow-up in 6-8 weeks is recommended to ensure resolution. Electronically Signed   By: Minerva Fester M.D.   On: 12/05/2022 02:36    Procedures Procedures    Medications Ordered in ED Medications  - No data to display  ED Course/ Medical Decision Making/ A&P                                 Medical Decision Making Amount and/or Complexity of Data Reviewed Radiology: ordered.   Patient presents today with complaints of cough and congestion x 5 days.  He is afebrile, nontoxic-appearing, and in no acute distress with reassuring vital signs.  Physical exam reveals a well-appearing patient resting comfortably lying flat in bed in no acute distress.  He is speaking in complete sentences.  Given duration of symptoms, x-ray imaging obtained of the patient's chest which has resulted and reveals   Bilateral bronchopneumonia. Dense consolidation in the right upper lobe is likely due to pneumonia however follow-up in 6-8 weeks is recommended to ensure resolution.  I have personally reviewed and interpreted this imaging and agree with radiology interpretation.  Discussed findings with patient, given patients chest x-ray I did consider further evaluation with labs, however patient is overall well appearing and is not short of breath. Shared decision making, patient would prefer to trial antibiotics at home with close return precautions. He does smoke cigarettes, marijuana, and vapes as well daily which is likely contributing to his x-ray imaging. Smoking cessation recommended and discussed. Will send for Augmentin, recommend close outpatient follow-up. Referral given to the wellness center as patient does not have a pcp. I have informed him of radiology recommendations to have a repeat chest x-ray. Will also send for promethazine cough syrup for cough suppression per patient's request.  Patient's COVID, flu, and RSV are negative. Evaluation and diagnostic testing in the emergency department does not suggest an emergent condition requiring admission or immediate intervention beyond what has been performed at this time.  Plan for discharge with close PCP follow-up.  Patient is understanding and amenable with  plan, educated on red flag symptoms that would prompt immediate return.  Patient discharged in stable condition.  Final Clinical Impression(s) / ED Diagnoses Final diagnoses:  Bilateral bronchopneumonia    Rx / DC Orders ED Discharge Orders          Ordered    amoxicillin-clavulanate (AUGMENTIN) 875-125 MG tablet  Every 12 hours        12/05/22 0330    promethazine-dextromethorphan (PROMETHAZINE-DM) 6.25-15 MG/5ML syrup  4 times daily PRN        12/05/22 0330          An After Visit Summary was printed and given to the patient.     Vear Clock 12/05/22 7035    Zadie Rhine, MD 12/05/22 [phone]

## 2023-07-16 ENCOUNTER — Ambulatory Visit (HOSPITAL_COMMUNITY)
Admission: EM | Admit: 2023-07-16 | Discharge: 2023-07-16 | Disposition: A | Discharge status: Home / Self Care | Attending: Physician Assistant | Admitting: Physician Assistant

## 2023-07-16 ENCOUNTER — Ambulatory Visit (INDEPENDENT_AMBULATORY_CARE_PROVIDER_SITE_OTHER)

## 2023-07-16 ENCOUNTER — Encounter (HOSPITAL_COMMUNITY): Payer: Self-pay

## 2023-07-16 DIAGNOSIS — S61233A Puncture wound without foreign body of left middle finger without damage to nail, initial encounter: Secondary | ICD-10-CM | POA: Diagnosis not present

## 2023-07-16 DIAGNOSIS — S6990XA Unspecified injury of unspecified wrist, hand and finger(s), initial encounter: Secondary | ICD-10-CM | POA: Diagnosis not present

## 2023-07-16 DIAGNOSIS — Z23 Encounter for immunization: Secondary | ICD-10-CM | POA: Diagnosis not present

## 2023-07-16 DIAGNOSIS — S6992XA Unspecified injury of left wrist, hand and finger(s), initial encounter: Secondary | ICD-10-CM | POA: Diagnosis not present

## 2023-07-16 MED ORDER — TETANUS-DIPHTH-ACELL PERTUSSIS 5-2.5-18.5 LF-MCG/0.5 IM SUSY
PREFILLED_SYRINGE | INTRAMUSCULAR | Status: AC
  Filled 2023-07-16: qty 0.5

## 2023-07-16 MED ORDER — TETANUS-DIPHTH-ACELL PERTUSSIS 5-2.5-18.5 LF-MCG/0.5 IM SUSY
0.5000 mL | PREFILLED_SYRINGE | Freq: Once | INTRAMUSCULAR | Status: AC
  Administered 2023-07-16: 0.5 mL via INTRAMUSCULAR

## 2023-07-16 NOTE — ED Provider Notes (Signed)
 [facility]    CSN: [id number] Arrival date & time: 07/16/23  1858      History   Chief Complaint Chief Complaint  Patient presents with   Finger Injury    HPI Frank Jordan is a 38 y.o. male.   HPI  Patient reports he was taking the trash out at work and thinks he was cut by a piece of glass on the left middle finger   He reports this occurred a few days ago- either Friday or Saturday He states the area has become swollen and is tender since this happened  He denies drainage, bleeding from the area  He denies difficulty bending finger   Most recent tdap was 2018  He reports wiping the area with alcohol pad after removing glass    History reviewed. No pertinent past medical history.  There are no active problems to display for this patient.   History reviewed. No pertinent surgical history.     Home Medications    Prior to Admission medications   Not on File    Family History History reviewed. No pertinent family history.  Social History Social History   Tobacco Use   Smoking status: Every Day    Current packs/day: 0.50    Average packs/day: 0.5 packs/day for 5.0 years (2.5 ttl pk-yrs)    Types: Cigarettes   Smokeless tobacco: Never  Substance Use Topics   Alcohol use: Yes   Drug use: Yes    Types: Marijuana     Allergies   Patient has no known allergies.   Review of Systems Review of Systems  Musculoskeletal:        Left middle finger injury      Physical Exam Triage Vital Signs ED Triage Vitals [07/16/23 1922]  Encounter Vitals Group     BP (!) 149/94     Systolic BP Percentile      Diastolic BP Percentile      Pulse Rate 74     Resp 18     Temp 98.2 F (36.8 C)     Temp Source Oral     SpO2 99 %     Weight      Height      Head Circumference      Peak Flow      Pain Score      Pain Loc      Pain Education      Exclude from Growth Chart    No data found.  Updated Vital Signs BP (!) 149/94 (BP  Location: Left Arm)   Pulse 74   Temp 98.2 F (36.8 C) (Oral)   Resp 18   SpO2 99%   Visual Acuity Right Eye Distance:   Left Eye Distance:   Bilateral Distance:    Right Eye Near:   Left Eye Near:    Bilateral Near:     Physical Exam Vitals reviewed.  Constitutional:      Appearance: Normal appearance.  HENT:     Head: Normocephalic and atraumatic.  Musculoskeletal:       Hands:     Comments: Patient has small puncture wound to the left middle finger.  There is no obvious signs of swelling, bruising, purulent drainage or bleeding.  He is able to flex and extend the finger without issue.  He is able to make a fist and finger strength appears to be 5/5.  Cap refills less than 2 seconds to distal aspect of middle finger.  Skin:  General: Skin is warm and dry.  Neurological:     General: No focal deficit present.     Mental Status: He is alert and oriented to person, place, and time.  Psychiatric:        Mood and Affect: Mood normal.        Behavior: Behavior normal.        Thought Content: Thought content normal.        Judgment: Judgment normal.      UC Treatments / Results  Labs (all labs ordered are listed, but only abnormal results are displayed) Labs Reviewed - No data to display  EKG   Radiology No results found.   Procedures Procedures (including critical care time)  Medications Ordered in UC Medications  Tdap (BOOSTRIX ) injection 0.5 mL (0.5 mLs Intramuscular Given 07/16/23 2009)    Initial Impression / Assessment and Plan / UC Course  I have reviewed the triage vital signs and the nursing notes.  Pertinent labs & imaging results that were available during my care of the patient were reviewed by me and considered in my medical decision making (see chart for details).      Final Clinical Impressions(s) / UC Diagnoses   Final diagnoses:  Finger injury, initial encounter  Puncture wound of left middle finger   Patient presents today with  concerns for finger injury sustained while taking trash out.  He reports finding a piece of glass stuck in his finger which he removed and then washing the area with soap water  and alcohol.  He reports concerns for continued foreign body as well as mild blisterlike formation at the area of injury.  Area was assessed with x-ray which did not find any retained foreign radiopaque bodies.  Physical exam appears reassuring with well-healing puncture wound.  Will update tetanus booster today and recommend keeping the area clean, dry.  ED return precautions reviewed and provided in after visit summary.  Follow-up as needed.    Discharge Instructions      You were seen today for concerns of a puncture wound to your left middle finger.  At this time the area does not appear to be infected and appears to be healing normally.  Your x-ray was negative for radiopaque foreign bodies which means that there does not appear to be anything remaining in your finger at this time. We have given you a tetanus booster to help prevent complications given the fact that your wound was likely from a puncture with a dirty piece of glass or object. You may start to develop a callus over the area as it heals which is normal. You can take Tylenol and ibuprofen as needed for pain management. Please make sure that you are keeping the area clean and dry and covering it to prevent contamination.  If at any point you start to develop swelling of the finger, difficulty bending the finger, redness, drainage that looks like pus please return to urgent care or go to the emergency room for further evaluation and management.     ED Prescriptions   None    PDMP not reviewed this encounter.   Frank Jordan, Frank Bottom, PA-C 07/19/23 1214

## 2023-07-16 NOTE — ED Triage Notes (Signed)
 Pt states taking trash out on Friday and pulled a piece of glass out of tip of lt middle finger. States now it has a blister and is tender.

## 2023-07-16 NOTE — Discharge Instructions (Addendum)
 You were seen today for concerns of a puncture wound to your left middle finger.  At this time the area does not appear to be infected and appears to be healing normally.  Your x-ray was negative for radiopaque foreign bodies which means that there does not appear to be anything remaining in your finger at this time. We have given you a tetanus booster to help prevent complications given the fact that your wound was likely from a puncture with a dirty piece of glass or object. You may start to develop a callus over the area as it heals which is normal. You can take Tylenol and ibuprofen as needed for pain management. Please make sure that you are keeping the area clean and dry and covering it to prevent contamination.  If at any point you start to develop swelling of the finger, difficulty bending the finger, redness, drainage that looks like pus please return to urgent care or go to the emergency room for further evaluation and management.

## 2023-08-17 ENCOUNTER — Ambulatory Visit (HOSPITAL_COMMUNITY)
Admission: EM | Admit: 2023-08-17 | Discharge: 2023-08-17 | Disposition: A | Discharge status: Home / Self Care | Attending: Physician Assistant | Admitting: Physician Assistant

## 2023-08-17 ENCOUNTER — Encounter (HOSPITAL_COMMUNITY): Payer: Self-pay | Admitting: *Deleted

## 2023-08-17 ENCOUNTER — Ambulatory Visit (INDEPENDENT_AMBULATORY_CARE_PROVIDER_SITE_OTHER)

## 2023-08-17 DIAGNOSIS — M25512 Pain in left shoulder: Secondary | ICD-10-CM | POA: Diagnosis not present

## 2023-08-17 MED ORDER — IBUPROFEN 600 MG PO TABS
600.0000 mg | ORAL_TABLET | Freq: Four times a day (QID) | ORAL | 0 refills | Status: AC | PRN
Start: 2023-08-17 — End: ?

## 2023-08-17 NOTE — ED Triage Notes (Signed)
 Pt states he has left shoulder pain today. No known injury. He isnt taking any meds. States he feels like it just wear and tear over the years causing it but he needs it fixed.   He states he is in no pain in triage, it comes and goes.

## 2023-08-17 NOTE — Discharge Instructions (Addendum)
 Take ibuprofen as needed Recommend shoulder stretching and exercises attached If no improvement recommend follow up with orthopedics

## 2023-08-17 NOTE — ED Provider Notes (Signed)
 [facility]    CSN: [id number] Arrival date & time: 08/17/23  1556      History   Chief Complaint Chief Complaint  Patient presents with   Shoulder Pain    HPI Frank Jordan is a 38 y.o. male.   Patient complains of left shoulder pain that is chronic but became worse after a fall a few weeks ago.  He reports he is not taking any medicine for the symptoms.  He complains of minimal pain today but requesting work note as he has been striction night and work makes pain worse.  He reports some increased pain when lying on the left side at night.    History reviewed. No pertinent past medical history.  There are no active problems to display for this patient.   History reviewed. No pertinent surgical history.     Home Medications    Prior to Admission medications   Medication Sig Start Date End Date Taking? Authorizing Provider  ibuprofen (ADVIL) 600 MG tablet Take 1 tablet (600 mg total) by mouth every 6 (six) hours as needed. 08/17/23  Yes Ward, Char Common, PA-C    Family History History reviewed. No pertinent family history.  Social History Social History   Tobacco Use   Smoking status: Every Day    Current packs/day: 0.50    Average packs/day: 0.5 packs/day for 5.0 years (2.5 ttl pk-yrs)    Types: Cigarettes   Smokeless tobacco: Never  Vaping Use   Vaping status: Never Used  Substance Use Topics   Alcohol use: Yes   Drug use: Yes    Types: Marijuana     Allergies   Patient has no known allergies.   Review of Systems Review of Systems  Constitutional:  Negative for chills and fever.  HENT:  Negative for ear pain and sore throat.   Eyes:  Negative for pain and visual disturbance.  Respiratory:  Negative for cough and shortness of breath.   Cardiovascular:  Negative for chest pain and palpitations.  Gastrointestinal:  Negative for abdominal pain and vomiting.  Genitourinary:  Negative for dysuria and hematuria.  Musculoskeletal:  Positive  for arthralgias (left shoulder). Negative for back pain.  Skin:  Negative for color change and rash.  Neurological:  Negative for seizures and syncope.  All other systems reviewed and are negative.    Physical Exam Triage Vital Signs ED Triage Vitals  Encounter Vitals Group     BP 08/17/23 1612 128/87     Systolic BP Percentile --      Diastolic BP Percentile --      Pulse Rate 08/17/23 1612 (!) 104     Resp 08/17/23 1612 18     Temp 08/17/23 1612 98 F (36.7 C)     Temp Source 08/17/23 1612 Oral     SpO2 08/17/23 1612 95 %     Weight --      Height --      Head Circumference --      Peak Flow --      Pain Score 08/17/23 1610 0     Pain Loc --      Pain Education --      Exclude from Growth Chart --    No data found.  Updated Vital Signs BP 128/87 (BP Location: Right Arm)   Pulse (!) 104   Temp 98 F (36.7 C) (Oral)   Resp 18   SpO2 95%   Visual Acuity Right Eye Distance:  Left Eye Distance:   Bilateral Distance:    Right Eye Near:   Left Eye Near:    Bilateral Near:     Physical Exam Vitals and nursing note reviewed.  Constitutional:      General: He is not in acute distress.    Appearance: He is well-developed.  HENT:     Head: Normocephalic and atraumatic.  Eyes:     Conjunctiva/sclera: Conjunctivae normal.  Cardiovascular:     Rate and Rhythm: Normal rate and regular rhythm.     Heart sounds: No murmur heard. Pulmonary:     Effort: Pulmonary effort is normal. No respiratory distress.     Breath sounds: Normal breath sounds.  Abdominal:     Palpations: Abdomen is soft.     Tenderness: There is no abdominal tenderness.  Musculoskeletal:        General: No swelling.     Cervical back: Neck supple.  Skin:    General: Skin is warm and dry.     Capillary Refill: Capillary refill takes less than 2 seconds.  Neurological:     Mental Status: He is alert.  Psychiatric:        Mood and Affect: Mood normal.      UC Treatments / Results   Labs (all labs ordered are listed, but only abnormal results are displayed) Labs Reviewed - No data to display  EKG   Radiology DG Shoulder Left Result Date: 08/17/2023 CLINICAL DATA:  Left shoulder pain.  No known injury. EXAM: LEFT SHOULDER - 2+ VIEW COMPARISON:  None Available. FINDINGS: There is no evidence of acute fracture or dislocation. There is no evidence of arthropathy or other focal bone abnormality. Soft tissues are unremarkable. IMPRESSION: Negative. Electronically Signed   By: Wyvonnia Heimlich M.D.   On: 08/17/2023 17:02    Procedures Procedures (including critical care time)  Medications Ordered in UC Medications - No data to display  Initial Impression / Assessment and Plan / UC Course  I have reviewed the triage vital signs and the nursing notes.  Pertinent labs & imaging results that were available during my care of the patient were reviewed by me and considered in my medical decision making (see chart for details).     Patient complains of left shoulder pain.  This pain is chronic but became worse after fall few days ago.  Imaging negative.  Advise stretching exercises.  Printout given.  Anti-inflammatory prescribed.  Work note given.  Orthopedic follow-up recommended. Final Clinical Impressions(s) / UC Diagnoses   Final diagnoses:  Pain in joint of left shoulder   Discharge Instructions      Take ibuprofen as needed Recommend shoulder stretching and exercises attached If no improvement recommend follow up with orthopedics  ED Prescriptions     Medication Sig Dispense Auth. Provider   ibuprofen (ADVIL) 600 MG tablet Take 1 tablet (600 mg total) by mouth every 6 (six) hours as needed. 30 tablet Ward, Jennifermarie Franzen Z, PA-C      PDMP not reviewed this encounter.   Ward, Char Common, PA-C 08/17/23 1715

## 2023-08-21 ENCOUNTER — Other Ambulatory Visit: Payer: Self-pay

## 2023-08-21 ENCOUNTER — Encounter (HOSPITAL_COMMUNITY): Payer: Self-pay | Admitting: Emergency Medicine

## 2023-08-21 ENCOUNTER — Ambulatory Visit (HOSPITAL_COMMUNITY)
Admission: EM | Admit: 2023-08-21 | Discharge: 2023-08-21 | Disposition: A | Discharge status: Home / Self Care | Attending: Internal Medicine | Admitting: Internal Medicine

## 2023-08-21 ENCOUNTER — Ambulatory Visit (INDEPENDENT_AMBULATORY_CARE_PROVIDER_SITE_OTHER)

## 2023-08-21 DIAGNOSIS — J439 Emphysema, unspecified: Secondary | ICD-10-CM | POA: Diagnosis not present

## 2023-08-21 DIAGNOSIS — R062 Wheezing: Secondary | ICD-10-CM

## 2023-08-21 DIAGNOSIS — R0602 Shortness of breath: Secondary | ICD-10-CM | POA: Diagnosis not present

## 2023-08-21 DIAGNOSIS — R918 Other nonspecific abnormal finding of lung field: Secondary | ICD-10-CM | POA: Diagnosis not present

## 2023-08-21 MED ORDER — ALBUTEROL SULFATE HFA 108 (90 BASE) MCG/ACT IN AERS
INHALATION_SPRAY | RESPIRATORY_TRACT | Status: AC
  Filled 2023-08-21: qty 6.7

## 2023-08-21 MED ORDER — AMOXICILLIN-POT CLAVULANATE 875-125 MG PO TABS: 1.0000 | ORAL_TABLET | Freq: Two times a day (BID) | ORAL | 0 refills | Status: AC

## 2023-08-21 MED ORDER — AZITHROMYCIN 250 MG PO TABS
ORAL_TABLET | ORAL | 0 refills | Status: AC
Start: 2023-08-21 — End: ?

## 2023-08-21 MED ORDER — ALBUTEROL SULFATE HFA 108 (90 BASE) MCG/ACT IN AERS
2.0000 | INHALATION_SPRAY | Freq: Once | RESPIRATORY_TRACT | Status: AC
  Administered 2023-08-21: 2 via RESPIRATORY_TRACT

## 2023-08-21 NOTE — ED Provider Notes (Signed)
 [facility]    CSN: [id number] Arrival date & time: 08/21/23  1238      History   Chief Complaint Chief Complaint  Patient presents with   Wheezing    HPI Frank Jordan is a 38 y.o. male.   38 year old male who presents urgent care with complaints of wheezing and shortness of breath.  He reports this started a little bit longer than 2 weeks ago but he is not sure.  He says at times he feels like he cannot catch his breath when he is talking.  He reports it is much worse when he is walking especially up stairs.  The wheezing is sometimes when he is sitting resting.  He did have a cold about 2 weeks ago but he used DayQuil and NyQuil and thought he had gotten over it.  He is having some coughing.  He denies fevers or chills, nausea, vomiting, abdominal pain, congestion.  He does smoke both marijuana and about a fourth a pack of cigarettes per day.  He has no known sick contacts except for people who have had colds around him.    Wheezing Associated symptoms: cough and shortness of breath   Associated symptoms: no chest pain, no ear pain, no fever, no rash and no sore throat     History reviewed. No pertinent past medical history.  There are no active problems to display for this patient.   History reviewed. No pertinent surgical history.     Home Medications    Prior to Admission medications   Medication Sig Start Date End Date Taking? Authorizing Provider  amoxicillin -clavulanate (AUGMENTIN ) 875-125 MG tablet Take 1 tablet by mouth every 12 (twelve) hours for 10 days. 08/21/23 08/31/23 Yes Mike Hamre A, PA-C  azithromycin  (ZITHROMAX ) 250 MG tablet Take first 2 tablets together, then 1 every day until finished. 08/21/23  Yes Ruqaya Strauss A, PA-C  ibuprofen (ADVIL) 600 MG tablet Take 1 tablet (600 mg total) by mouth every 6 (six) hours as needed. Patient taking differently: Take 600 mg by mouth every 6 (six) hours as needed. HAS NOT PICKED THIS MEDICINE UP  FROM PHARMACY 08/17/23   Ward, Char Common, PA-C    Family History History reviewed. No pertinent family history.  Social History Social History   Tobacco Use   Smoking status: Every Day    Current packs/day: 0.50    Average packs/day: 0.5 packs/day for 5.0 years (2.5 ttl pk-yrs)    Types: Cigarettes   Smokeless tobacco: Never  Vaping Use   Vaping status: Some Days  Substance Use Topics   Alcohol use: Yes   Drug use: Yes    Types: Marijuana     Allergies   Patient has no known allergies.   Review of Systems Review of Systems  Constitutional:  Negative for chills and fever.  HENT:  Negative for ear pain and sore throat.   Eyes:  Negative for pain and visual disturbance.  Respiratory:  Positive for cough, shortness of breath and wheezing.   Cardiovascular:  Negative for chest pain and palpitations.  Gastrointestinal:  Negative for abdominal pain and vomiting.  Genitourinary:  Negative for dysuria and hematuria.  Musculoskeletal:  Negative for arthralgias and back pain.  Skin:  Negative for color change and rash.  Neurological:  Negative for seizures and syncope.  All other systems reviewed and are negative.    Physical Exam Triage Vital Signs ED Triage Vitals  Encounter Vitals Group     BP 08/21/23 1339  120/80     Systolic BP Percentile --      Diastolic BP Percentile --      Pulse Rate 08/21/23 1339 77     Resp 08/21/23 1339 18     Temp 08/21/23 1339 97.8 F (36.6 C)     Temp Source 08/21/23 1339 Oral     SpO2 08/21/23 1339 95 %     Weight --      Height --      Head Circumference --      Peak Flow --      Pain Score 08/21/23 1336 4     Pain Loc --      Pain Education --      Exclude from Growth Chart --    No data found.  Updated Vital Signs BP 120/80 (BP Location: Left Arm)   Pulse 77   Temp 97.8 F (36.6 C) (Oral)   Resp 18   SpO2 95%   Visual Acuity Right Eye Distance:   Left Eye Distance:   Bilateral Distance:    Right Eye Near:    Left Eye Near:    Bilateral Near:     Physical Exam Vitals and nursing note reviewed.  Constitutional:      General: He is not in acute distress.    Appearance: He is well-developed.     Comments: Able to carry on an extensive conversation without stopping to catch his breath.  HENT:     Head: Normocephalic and atraumatic.  Eyes:     Conjunctiva/sclera: Conjunctivae normal.  Cardiovascular:     Rate and Rhythm: Normal rate and regular rhythm.     Heart sounds: No murmur heard. Pulmonary:     Effort: Pulmonary effort is normal. No tachypnea, accessory muscle usage or respiratory distress.     Breath sounds: Examination of the right-upper field reveals decreased breath sounds and wheezing. Examination of the left-upper field reveals decreased breath sounds and wheezing. Examination of the right-middle field reveals wheezing. Examination of the left-middle field reveals wheezing. Examination of the right-lower field reveals wheezing. Examination of the left-lower field reveals wheezing. Decreased breath sounds and wheezing present.     Comments: Mild decrease in the upper lobes Abdominal:     Palpations: Abdomen is soft.     Tenderness: There is no abdominal tenderness.  Musculoskeletal:        General: No swelling.     Cervical back: Neck supple.  Skin:    General: Skin is warm and dry.     Capillary Refill: Capillary refill takes less than 2 seconds.  Neurological:     Mental Status: He is alert.  Psychiatric:        Mood and Affect: Mood normal.      UC Treatments / Results  Labs (all labs ordered are listed, but only abnormal results are displayed) Labs Reviewed - No data to display  EKG   Radiology DG Chest 2 View Result Date: 08/21/2023 CLINICAL DATA:  Shortness of breath, wheezing. EXAM: CHEST - 2 VIEW COMPARISON:  12/05/2022 FINDINGS: Bullous emphysematous changes. Stable chronic consolidation and reticulonodular opacities most notable in the upper and mid lung  zones. While this could reflect recurrent pneumonia, chronic lung disease is a possibility. No effusions. No acute bony abnormality. IMPRESSION: Stable patchy bilateral airspace opacities and reticulonodular densities throughout the lungs. Consider further evaluation with chest CT. Bullous emphysema. Electronically Signed   By: Janeece Mechanic M.D.   On: 08/21/2023 14:20  Procedures Procedures (including critical care time)  Medications Ordered in UC Medications  albuterol (VENTOLIN HFA) 108 (90 Base) MCG/ACT inhaler 2 puff (2 puffs Inhalation Given 08/21/23 1413)    Initial Impression / Assessment and Plan / UC Course  I have reviewed the triage vital signs and the nursing notes.  Pertinent labs & imaging results that were available during my care of the patient were reviewed by me and considered in my medical decision making (see chart for details).     Wheezing - Plan: DG Chest 2 View, DG Chest 2 View  Shortness of breath - Plan: DG Chest 2 View, DG Chest 2 View   Chest x-ray done today shows persistent area in the right lung that radiology is recommending a CT of the chest.  This does have a very similar appearance to the x-ray done in September when he was diagnosed with bronchopneumonia bilateral.  There is also changes concerning for bullous emphysema.  We will treat for recurrent pneumonia however given the exact appearance of the x-ray, other etiology is more likely.  We will treat with Augmentin  and azithromycin .  We have scheduled him an appointment with primary care for tomorrow and have reinforced the need to keep this appointment as this needs to be further evaluated.  Patient understands.  We have also given him an albuterol inhaler to use.  He can go to the emergency room if he feels like his symptoms are worsening.  Final Clinical Impressions(s) / UC Diagnoses   Final diagnoses:  Wheezing  Shortness of breath     Discharge Instructions      X-ray of the chest done  today which shows a persistent area in the right lung that could be recurrent pneumonia versus a lung disease.  This needs to be further evaluated with a CT of the chest.  We have set you up an appointment to see a primary care physician who will be able to coordinate this.  In the interim we will cover for pneumonia in case this is a recurrent pneumonia. Instructions as follows:  Augmentin  875 mg twice daily for 10 days.  This is an antibiotic.  Take this with food. Azithromycin  250 mg Take 2 tablets today and the 1 tablet daily for 4 more days. Albuterol inhaler 1-2 puffs every 6 hours as needed for wheezing/shortness of breath.  Keep appointment as scheduled with your PCP that we have scheduled for you Return to urgent care or PCP if symptoms worsen or fail to resolve.     ED Prescriptions     Medication Sig Dispense Auth. Provider   amoxicillin -clavulanate (AUGMENTIN ) 875-125 MG tablet Take 1 tablet by mouth every 12 (twelve) hours for 10 days. 20 tablet Lorenzo Romberg A, PA-C   azithromycin  (ZITHROMAX ) 250 MG tablet Take first 2 tablets together, then 1 every day until finished. 6 tablet Kreg Pesa, PA-C      PDMP not reviewed this encounter.   Kreg Pesa, [place] 08/21/23 1624

## 2023-08-21 NOTE — ED Triage Notes (Signed)
 Patient noticed sob and wheezing with walking up and down steps   Patient was seen 08/17/2023 for a different issure

## 2023-08-21 NOTE — Discharge Instructions (Addendum)
 X-ray of the chest done today which shows a persistent area in the right lung that could be recurrent pneumonia versus a lung disease.  This needs to be further evaluated with a CT of the chest.  We have set you up an appointment to see a primary care physician who will be able to coordinate this.  In the interim we will cover for pneumonia in case this is a recurrent pneumonia. Instructions as follows:  Augmentin  875 mg twice daily for 10 days.  This is an antibiotic.  Take this with food. Azithromycin  250 mg Take 2 tablets today and the 1 tablet daily for 4 more days. Albuterol inhaler 1-2 puffs every 6 hours as needed for wheezing/shortness of breath.  Keep appointment as scheduled with your PCP that we have scheduled for you Return to urgent care or PCP if symptoms worsen or fail to resolve.

## 2023-08-22 ENCOUNTER — Ambulatory Visit (HOSPITAL_BASED_OUTPATIENT_CLINIC_OR_DEPARTMENT_OTHER): Admitting: Family Medicine

## 2024-03-22 ENCOUNTER — Emergency Department (HOSPITAL_COMMUNITY)
Admission: EM | Admit: 2024-03-22 | Discharge: 2024-03-22 | Disposition: A | Discharge status: Home / Self Care | Payer: Self-pay | Attending: Emergency Medicine | Admitting: Emergency Medicine

## 2024-03-22 ENCOUNTER — Encounter (HOSPITAL_COMMUNITY): Payer: Self-pay

## 2024-03-22 ENCOUNTER — Other Ambulatory Visit: Payer: Self-pay

## 2024-03-22 ENCOUNTER — Emergency Department (HOSPITAL_COMMUNITY): Payer: Self-pay

## 2024-03-22 DIAGNOSIS — R918 Other nonspecific abnormal finding of lung field: Secondary | ICD-10-CM | POA: Insufficient documentation

## 2024-03-22 DIAGNOSIS — R9389 Abnormal findings on diagnostic imaging of other specified body structures: Secondary | ICD-10-CM

## 2024-03-22 DIAGNOSIS — F17200 Nicotine dependence, unspecified, uncomplicated: Secondary | ICD-10-CM | POA: Insufficient documentation

## 2024-03-22 DIAGNOSIS — R0609 Other forms of dyspnea: Secondary | ICD-10-CM | POA: Insufficient documentation

## 2024-03-22 LAB — BASIC METABOLIC PANEL WITH GFR
Anion gap: 10 (ref 5–15)
BUN: 11 mg/dL (ref 6–20)
CO2: 24 mmol/L (ref 22–32)
Calcium: 9.1 mg/dL (ref 8.9–10.3)
Chloride: 101 mmol/L (ref 98–111)
Creatinine, Ser: 1.15 mg/dL (ref 0.61–1.24)
GFR, Estimated: >60 mL/min
Glucose, Bld: 186 mg/dL — ABNORMAL HIGH (ref 70–99)
Potassium: 4.5 mmol/L (ref 3.5–5.1)
Sodium: 135 mmol/L (ref 135–145)

## 2024-03-22 LAB — CBC WITH DIFFERENTIAL/PLATELET
Abs Immature Granulocytes: 0.01 K/uL (ref 0.00–0.07)
Basophils Absolute: 0 K/uL (ref 0.0–0.1)
Basophils Relative: 1 %
Eosinophils Absolute: 0.1 K/uL (ref 0.0–0.5)
Eosinophils Relative: 3 %
HCT: 49.2 % (ref 39.0–52.0)
Hemoglobin: 16.3 g/dL (ref 13.0–17.0)
Immature Granulocytes: 0 %
Lymphocytes Relative: 16 %
Lymphs Abs: 0.5 K/uL — ABNORMAL LOW (ref 0.7–4.0)
MCH: 30.1 pg (ref 26.0–34.0)
MCHC: 33.1 g/dL (ref 30.0–36.0)
MCV: 90.8 fL (ref 80.0–100.0)
Monocytes Absolute: 0.5 K/uL (ref 0.1–1.0)
Monocytes Relative: 17 %
Neutro Abs: 1.8 K/uL (ref 1.7–7.7)
Neutrophils Relative %: 63 %
Platelets: 244 K/uL (ref 150–400)
RBC: 5.42 MIL/uL (ref 4.22–5.81)
RDW: 12.6 % (ref 11.5–15.5)
WBC: 2.8 K/uL — ABNORMAL LOW (ref 4.0–10.5)
nRBC: 0 % (ref 0.0–0.2)

## 2024-03-22 MED ORDER — ALBUTEROL SULFATE HFA 108 (90 BASE) MCG/ACT IN AERS
2.0000 | INHALATION_SPRAY | Freq: Once | RESPIRATORY_TRACT | Status: AC
  Administered 2024-03-22: 2 via RESPIRATORY_TRACT
  Filled 2024-03-22: qty 6.7

## 2024-03-22 MED ORDER — PREDNISONE 10 MG PO TABS: 50.0000 mg | ORAL_TABLET | Freq: Every day | ORAL | 0 refills | Status: AC

## 2024-03-22 NOTE — ED Provider Notes (Signed)
 " [facility] [facility] Provider Note   CSN: [id number] Arrival date & time: 03/22/24  1131     History Chief Complaint  Patient presents with   Wheezing    HPI: Frank Jordan is a 39 y.o. male with history pertinent marijuana use disorder, tobacco use disorder who presents complaining of shortness of breath. Patient arrived via POV.  History provided by patient.  No interpreter required during this encounter.  Patient presents to the emergency department complaining of wheezing, when asked what he means specifically by wheezing, patient states that on exertion or while carrying on prolonged conversation he feels dyspneic, and occasionally feels that he is breathing is noisy.  Denies ever being formally diagnosed with asthma, COPD, though does endorse utilizing marijuana as well as tobacco regularly.  Patient denies other medical history.  With regard to tuberculosis risk factors, no recent night sweats, hemoptysis, patient is not undomiciled and has not worked with the [employer], has not been elicited any developing nations, has previously been incarcerated for prolonged (greater than 1 year), does not work with the incarcerated population.  Denies fever, chills, chest pain, nausea, vomiting, diarrhea.  Patient's recorded medical, surgical, social, medication list and allergies were reviewed in the Snapshot window as part of the initial history.   Prior to Admission medications  Medication Sig Start Date End Date Taking? Authorizing Provider  azithromycin  (ZITHROMAX ) 250 MG tablet Take first 2 tablets together, then 1 every day until finished. 08/21/23   White, Elizabeth A, PA-C  ibuprofen  (ADVIL ) 600 MG tablet Take 1 tablet (600 mg total) by mouth every 6 (six) hours as needed. Patient taking differently: Take 600 mg by mouth every 6 (six) hours as needed. HAS NOT PICKED THIS MEDICINE UP FROM PHARMACY 08/17/23   Ward, Harlene PEDLAR, PA-C      Allergies: Patient has no known allergies.   Review of Systems   ROS as per HPI  Physical Exam Updated Vital Signs BP (!) 140/98   Pulse (!) 107   Temp 97.9 F (36.6 C)   Resp 14   SpO2 98%  Physical Exam Vitals and nursing note reviewed.  Constitutional:      General: He is not in acute distress.    Appearance: He is well-developed.  HENT:     Head: Normocephalic and atraumatic.  Eyes:     Conjunctiva/sclera: Conjunctivae normal.  Cardiovascular:     Rate and Rhythm: Normal rate and regular rhythm.     Heart sounds: No murmur heard. Pulmonary:     Effort: Pulmonary effort is normal. No respiratory distress.     Breath sounds: Normal breath sounds. No wheezing or rhonchi.  Abdominal:     Palpations: Abdomen is soft.     Tenderness: There is no abdominal tenderness.  Musculoskeletal:        General: No swelling.     Cervical back: Neck supple.  Skin:    General: Skin is warm and dry.     Capillary Refill: Capillary refill takes less than 2 seconds.  Neurological:     Mental Status: He is alert.  Psychiatric:        Mood and Affect: Mood normal.     ED Course/ Medical Decision Making/ A&P  Procedures Procedures   Medications Ordered in ED Medications - No data to display  Medical Decision Making:   Frank Jordan is a 39 y.o. male who presents for dyspnea as per above.  Physical exam  is pertinent for no focal abnormality.   The differential includes but is not limited to wheezing, undiagnosed COPD, undiagnosed asthma, pneumonia, tuberculosis.  Independent historian: None  External data reviewed: Notes: Reviewed patient's chart, patient had a urgent care visit in June 2025 where he presented to urgent care also complaining of wheezing, was found to have scattered wheezing on exam, and opacifications on right lung on chest x-ray that radiology recommended getting a CT of the chest, which was deferred given per the provider's interpretation the chest x-ray  was similar to pneumonia that was diagnosed in September 2024.  Patient was ultimately discharged with prescriptions for Augmentin , azithromycin , albuterol   Initial Plan:  Screening labs including CBC and Metabolic panel to evaluate for infectious or metabolic etiology of disease.  Chest x-ray to evaluate for structural/infectious intrathoracic pathology. CT chest to further evaluate chronic lung abnormalities Objective evaluation as below reviewed   Labs: Ordered, Independent interpretation, and Details: CBC with mild nonspecific leukopenia.  No anemia or thrombocytopenia.  BMP without AKI or emergent electrolyte derangement  Radiology: Ordered, Independent interpretation, and Details: Personally viewed chest x-ray, I do appreciate airspace opacifications in the mid lung fields bilaterally, overall similar in comparison to prior from 6/25 as well as 9/24.  High-resolution CT not yet obtained by the end of my shift, however I did review the radiology read of the chest x-ray and agree. DG Chest 2 View Result Date: 03/22/2024 EXAM: 2 VIEW(S) XRAY OF THE CHEST 03/22/2024 12:37:00 PM COMPARISON: 08/21/2023 CLINICAL HISTORY: wheezing FINDINGS: LUNGS AND PLEURA: Right apical bullous emphysematous changes. Scarring and architectural distortion within the perihilar left upper lobe are stable from the previous study. Chronic opacity in right upper lobe, unchanged. Reticulonodular opacities in both lung fields, with more confluent opacification in left upper lobe, similar to prior study. No pleural effusion. No pneumothorax. HEART AND MEDIASTINUM: No acute abnormality of the cardiac and mediastinal silhouettes. BONES AND SOFT TISSUES: No acute osseous abnormality. IMPRESSION: 1. Right apical bullous emphysematous change 2. Stable chronic reticulonodular opacities and left upper lobe scarring/architectural distortion, most suggestive of prior granulomatous infection (including prior tuberculosis) or sarcoidosis. 3.  The right upper lobe perihilar opacity is also unchanged, dating back to 12/05/2022. 4. Consider follow-up imaging with high-resolution CT of the chest for better characterization of the lung parenchymal abnormalities. Electronically signed by: Waddell Calk MD 03/22/2024 12:43 PM EST RP Workstation: [id number]    EKG/Medicine tests: Not indicated EKG Interpretation:    Interventions: None  See the EMR for full details regarding lab and imaging results.  Patient presents to the emergency department for dyspnea on exertion, describes this as wheezing, however patient does not have any wheezing on exam.  Labs are reassuring, physical exam reassuring, however recently, patient has persistent opacifications on his chest x-ray, which have been present since at least 2024.  Patient was seen in urgent care in summer 2025 and had persistent opacifications and was retreated for pneumonia, however has continuation of these abnormalities, therefore do feel that patient warrants high-resolution CT of his chest to further evaluate for possible tuberculosis, granulomatous disease, etc.  Patient does have risk factors for tuberculosis including prior incarceration, does not have any symptoms of active tuberculosis such as night sweats, productive cough, weight loss.  At the time of handoff, CT had yet to be obtained, plan to follow-up this imaging, consider consulting infectious disease if necessary pending results of CT, ultimately likely discharge.  Presentation is most consistent with acute complicated illness and Current presentation  is complicated by underlying chronic conditions  Discussion of management or test interpretations with external provider(s): None by the time of handoff  Risk Drugs:None  Disposition: HANDOFF: At the time of signout, the patients CT had not yet been completed. I transferred care of the patient at the time of signout to Dr. Charlyn. I informed the incoming care provider of the  patient's history, status, and management plan. I addressed all of their concerns and/or questions to the best of my ability. Please refer to the incoming care provider's note for details regarding the remainder of the patient's ED course and disposition.  MDM generated using voice dictation software and may contain dictation errors.  Please contact me for any clarification or with any questions.  Clinical Impression:  1. Abnormal chest x-ray   2. Dyspnea on exertion      Data Unavailable   Final Clinical Impression(s) / ED Diagnoses Final diagnoses:  Abnormal chest x-ray  Dyspnea on exertion    Rx / DC Orders ED Discharge Orders     None        Rogelia Jerilynn RAMAN, MD 03/22/24 1606  "

## 2024-03-22 NOTE — ED Provider Notes (Signed)
" °  Physical Exam  BP (!) 140/98   Pulse (!) 107   Temp 97.9 F (36.6 C)   Resp 14   SpO2 98%   Physical Exam  Procedures  Procedures  ED Course / MDM   Clinical Course as of 03/22/24 1745  Sun Mar 22, 2024  1545 Pt comes in with cc of wheezing. Has no pmhx. Has DOE. 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 + incarceration hx. Xray shows persistent opacification. No fam hx of sarcoidosis.  CT ordered.  Anticipate d/c with Pulm follow up. [AN]    Clinical Course User Index [AN] Charlyn Sora, MD   Medical Decision Making Amount and/or Complexity of Data Reviewed Radiology: ordered.  Risk Prescription drug management.   CT scan findings are more concerning for sarcoidosis. Patient does not have insurance.  At this time he is not having any wheezing, but he reports that he has used his family's inhaler before and it has helped.  We will give him an inhaler and I will put him on 5-day course of prednisone .  I have sent a message to clinical social worker to see if they can help get him an appointment with community wellness or pulmonary clinic.  Pulmonary clinic information has been provided to the patient.  He has been made aware that it is essential for him to follow-up with pulmonary doctors so that they can confirm and then optimally manage the diagnosis.     Charlyn Sora, MD 03/22/24 1749  "

## 2024-03-22 NOTE — ED Provider Triage Note (Signed)
 Emergency Medicine Provider Triage Evaluation Note  Frank Jordan , a 39 y.o. male  was evaluated in triage.  Pt complains of wheezing on and off for a long time now.  Reports that he currently feels at his baseline.  Reports shortness of breath sometimes.  Reports smoking nicotine and marijuana.  Denies any fever, chills, cough.  Denies any chest pain.  Review of Systems  Positive: As above Negative: As above  Physical Exam  BP (!) 140/98   Pulse (!) 107   Temp 97.9 F (36.6 C)   Resp 14   SpO2 98%  Gen:   Awake, no distress   Resp:  Normal effort. No wheezing or abnormal lung sounds MSK:   Moves extremities without difficulty    Medical Decision Making  Medically screening exam initiated at 11:59 AM.  Appropriate orders placed.  Frank Jordan was informed that the remainder of the evaluation will be completed by another provider, this initial triage assessment does not replace that evaluation, and the importance of remaining in the ED until their evaluation is complete.     Veta Palma, PA-C 03/22/24 1159

## 2024-03-22 NOTE — Discharge Instructions (Addendum)
 You were seen in the emergency room for shortness of breath with exertion.  Our CT scan shows findings that are concerning for a condition called sarcoidosis.  You will need to see a pulmonary doctor by calling the number provided to set up an appointment so that they can confirm the diagnosis and optimally manage it.  Call the number provided.  Take the steroids that are prescribed.  Use inhaler as needed.  Return to the emergency room if you start having worsening chest pain, shortness of breath.  Refrain from any vaping, smoking.

## 2024-03-22 NOTE — ED Triage Notes (Signed)
 PT arrives via POV. PT reports he has been experiencing wheezing for about 1 month. PT arrives AxOx4. NAD. Able to speak in complete sentences. Denies hx of asthma.
